# Patient Record
Sex: Female | Born: 1970 | Race: Black or African American | Hispanic: Refuse to answer | Marital: Married | Smoking: Never smoker
Health system: Southern US, Community
[De-identification: ages and names within clinical notes are randomized; demographics above are authoritative.]

## PROBLEM LIST (undated history)

## (undated) DIAGNOSIS — E669 Obesity, unspecified: Secondary | ICD-10-CM

## (undated) DIAGNOSIS — J189 Pneumonia, unspecified organism: Secondary | ICD-10-CM

## (undated) DIAGNOSIS — Z9889 Other specified postprocedural states: Secondary | ICD-10-CM

## (undated) DIAGNOSIS — R519 Headache, unspecified: Secondary | ICD-10-CM

## (undated) DIAGNOSIS — R112 Nausea with vomiting, unspecified: Secondary | ICD-10-CM

## (undated) DIAGNOSIS — O009 Unspecified ectopic pregnancy without intrauterine pregnancy: Secondary | ICD-10-CM

## (undated) DIAGNOSIS — R229 Localized swelling, mass and lump, unspecified: Secondary | ICD-10-CM

## (undated) DIAGNOSIS — D219 Benign neoplasm of connective and other soft tissue, unspecified: Secondary | ICD-10-CM

## (undated) DIAGNOSIS — T8859XA Other complications of anesthesia, initial encounter: Secondary | ICD-10-CM

## (undated) DIAGNOSIS — T4145XA Adverse effect of unspecified anesthetic, initial encounter: Secondary | ICD-10-CM

## (undated) DIAGNOSIS — IMO0002 Reserved for concepts with insufficient information to code with codable children: Secondary | ICD-10-CM

## (undated) DIAGNOSIS — K219 Gastro-esophageal reflux disease without esophagitis: Secondary | ICD-10-CM

## (undated) HISTORY — DX: Unspecified ectopic pregnancy without intrauterine pregnancy: O00.90

## (undated) HISTORY — PX: REDUCTION MAMMAPLASTY: SUR839

## (undated) HISTORY — PX: FOOT SURGERY: SHX648

## (undated) HISTORY — DX: Obesity, unspecified: E66.9

## (undated) HISTORY — PX: CYST REMOVAL HAND: SHX6279

## (undated) HISTORY — PX: OVARIAN CYST REMOVAL: SHX89

## (undated) HISTORY — DX: Headache, unspecified: R51.9

## (undated) HISTORY — DX: Pneumonia, unspecified organism: J18.9

---

## 1999-10-11 ENCOUNTER — Ambulatory Visit (HOSPITAL_COMMUNITY): Admission: RE | Admit: 1999-10-11 | Discharge: 1999-10-11 | Payer: Self-pay | Admitting: Obstetrics and Gynecology

## 1999-10-11 ENCOUNTER — Encounter (INDEPENDENT_AMBULATORY_CARE_PROVIDER_SITE_OTHER): Payer: Self-pay

## 2000-08-11 ENCOUNTER — Ambulatory Visit (HOSPITAL_BASED_OUTPATIENT_CLINIC_OR_DEPARTMENT_OTHER): Admission: RE | Admit: 2000-08-11 | Discharge: 2000-08-12 | Payer: Self-pay | Admitting: Specialist

## 2000-11-04 ENCOUNTER — Other Ambulatory Visit: Admission: RE | Admit: 2000-11-04 | Discharge: 2000-11-04 | Payer: Self-pay | Admitting: Obstetrics and Gynecology

## 2002-02-16 ENCOUNTER — Other Ambulatory Visit: Admission: RE | Admit: 2002-02-16 | Discharge: 2002-02-16 | Payer: Self-pay | Admitting: Obstetrics and Gynecology

## 2003-03-01 ENCOUNTER — Other Ambulatory Visit: Admission: RE | Admit: 2003-03-01 | Discharge: 2003-03-01 | Payer: Self-pay | Admitting: Obstetrics and Gynecology

## 2003-09-05 ENCOUNTER — Encounter: Payer: Self-pay | Admitting: *Deleted

## 2003-09-05 ENCOUNTER — Encounter: Admission: RE | Admit: 2003-09-05 | Discharge: 2003-09-05 | Payer: Self-pay | Admitting: *Deleted

## 2003-09-08 ENCOUNTER — Encounter: Payer: Self-pay | Admitting: *Deleted

## 2003-09-08 ENCOUNTER — Encounter: Admission: RE | Admit: 2003-09-08 | Discharge: 2003-09-08 | Payer: Self-pay | Admitting: *Deleted

## 2004-03-20 ENCOUNTER — Other Ambulatory Visit: Admission: RE | Admit: 2004-03-20 | Discharge: 2004-03-20 | Payer: Self-pay | Admitting: Obstetrics and Gynecology

## 2005-01-14 ENCOUNTER — Encounter: Admission: RE | Admit: 2005-01-14 | Discharge: 2005-01-14 | Payer: Self-pay | Admitting: Family Medicine

## 2005-01-15 ENCOUNTER — Encounter: Admission: RE | Admit: 2005-01-15 | Discharge: 2005-01-15 | Payer: Self-pay | Admitting: Family Medicine

## 2005-01-29 ENCOUNTER — Observation Stay (HOSPITAL_COMMUNITY): Admission: AD | Admit: 2005-01-29 | Discharge: 2005-01-30 | Payer: Self-pay | Admitting: Obstetrics and Gynecology

## 2005-02-01 ENCOUNTER — Observation Stay (HOSPITAL_COMMUNITY): Admission: EM | Admit: 2005-02-01 | Discharge: 2005-02-02 | Payer: Self-pay | Admitting: Obstetrics and Gynecology

## 2005-02-01 ENCOUNTER — Encounter (INDEPENDENT_AMBULATORY_CARE_PROVIDER_SITE_OTHER): Payer: Self-pay | Admitting: *Deleted

## 2005-04-01 ENCOUNTER — Other Ambulatory Visit: Admission: RE | Admit: 2005-04-01 | Discharge: 2005-04-01 | Payer: Self-pay | Admitting: Obstetrics and Gynecology

## 2005-04-30 ENCOUNTER — Ambulatory Visit (HOSPITAL_COMMUNITY): Admission: RE | Admit: 2005-04-30 | Discharge: 2005-04-30 | Payer: Self-pay | Admitting: Obstetrics and Gynecology

## 2006-02-03 ENCOUNTER — Encounter: Admission: RE | Admit: 2006-02-03 | Discharge: 2006-02-03 | Payer: Self-pay | Admitting: Family Medicine

## 2006-04-02 ENCOUNTER — Other Ambulatory Visit: Admission: RE | Admit: 2006-04-02 | Discharge: 2006-04-02 | Payer: Self-pay | Admitting: Obstetrics and Gynecology

## 2007-02-12 ENCOUNTER — Encounter: Admission: RE | Admit: 2007-02-12 | Discharge: 2007-02-12 | Payer: Self-pay | Admitting: Obstetrics and Gynecology

## 2007-04-08 ENCOUNTER — Ambulatory Visit (HOSPITAL_COMMUNITY): Admission: RE | Admit: 2007-04-08 | Discharge: 2007-04-08 | Payer: Self-pay | Admitting: Obstetrics and Gynecology

## 2007-04-08 ENCOUNTER — Encounter (INDEPENDENT_AMBULATORY_CARE_PROVIDER_SITE_OTHER): Payer: Self-pay | Admitting: Specialist

## 2008-01-24 ENCOUNTER — Inpatient Hospital Stay (HOSPITAL_COMMUNITY): Admission: AD | Admit: 2008-01-24 | Discharge: 2008-01-24 | Payer: Self-pay | Admitting: Obstetrics and Gynecology

## 2008-03-24 ENCOUNTER — Inpatient Hospital Stay (HOSPITAL_COMMUNITY): Admission: AD | Admit: 2008-03-24 | Discharge: 2008-03-24 | Payer: Self-pay | Admitting: Obstetrics and Gynecology

## 2008-05-07 ENCOUNTER — Inpatient Hospital Stay (HOSPITAL_COMMUNITY): Admission: AD | Admit: 2008-05-07 | Discharge: 2008-05-12 | Payer: Self-pay | Admitting: Obstetrics and Gynecology

## 2008-05-11 ENCOUNTER — Encounter (INDEPENDENT_AMBULATORY_CARE_PROVIDER_SITE_OTHER): Payer: Self-pay | Admitting: Obstetrics and Gynecology

## 2009-04-10 IMAGING — US US OB LIMITED
1 series · 14 of 22 positions shown · non-contrast
Comparison: none

OBSTETRICAL ULTRASOUND:
 This ultrasound exam was performed in the [HOSPITAL] Ultrasound Department.  The OB US report was generated in the AS system, and faxed to the ordering physician.  This report is also available in [REDACTED] PACS.

[Series 1: us ob limited · 0.30mm/px · 14 of 22 slices shown]
[im 1/22]
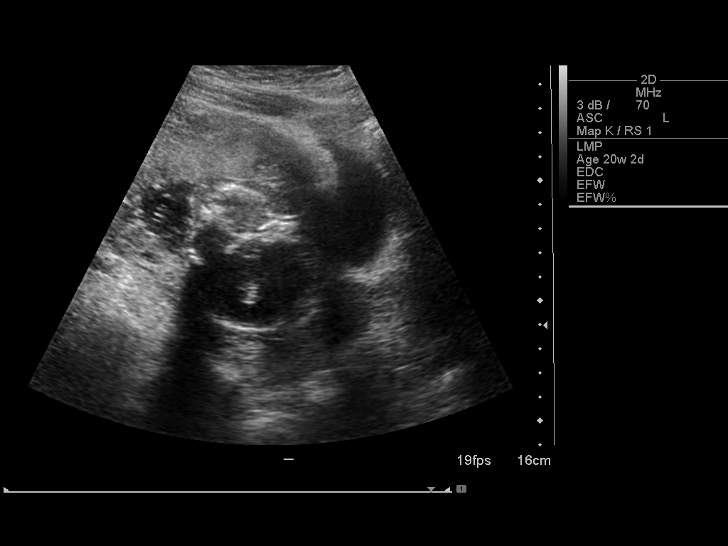
[im 3/22]
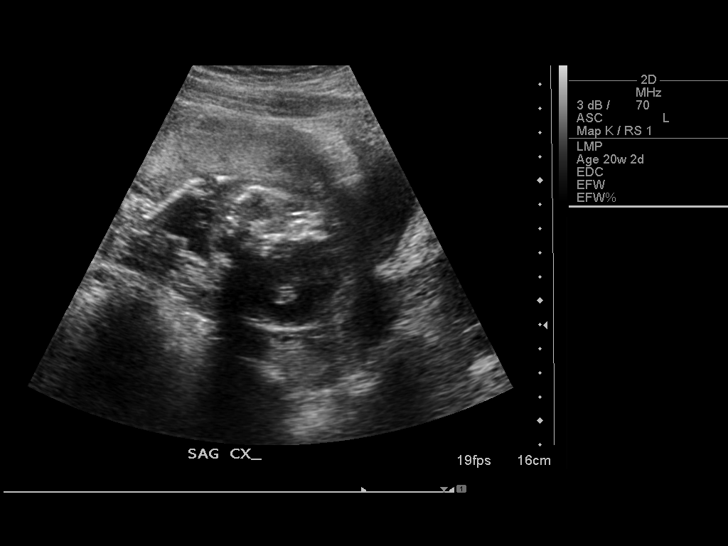
[im 4/22]
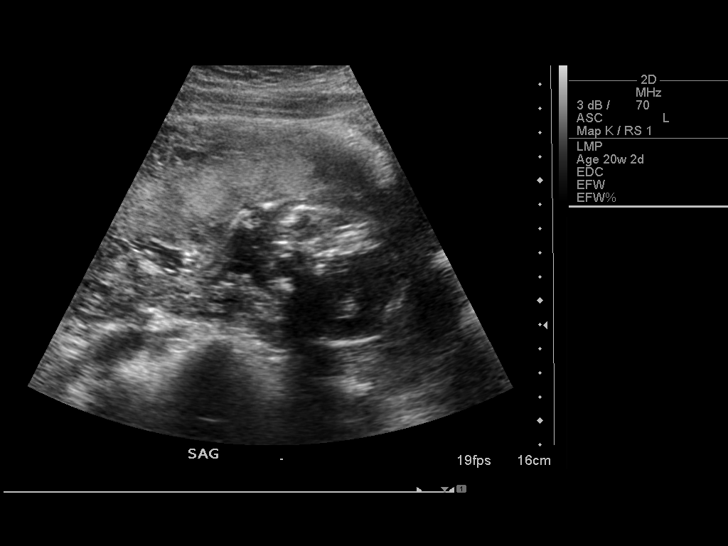
[im 6/22]
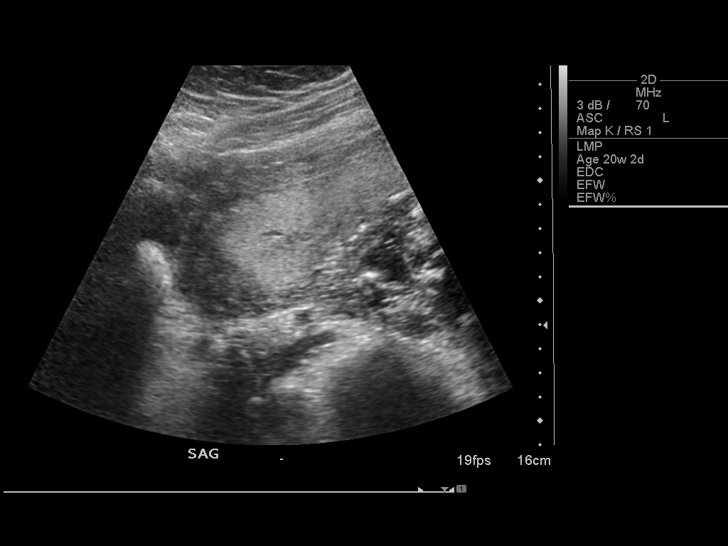
[im 8/22]
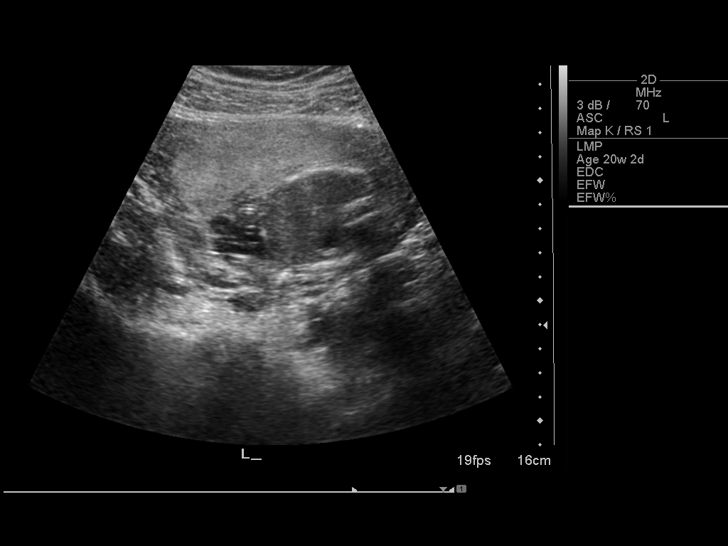
[im 9/22]
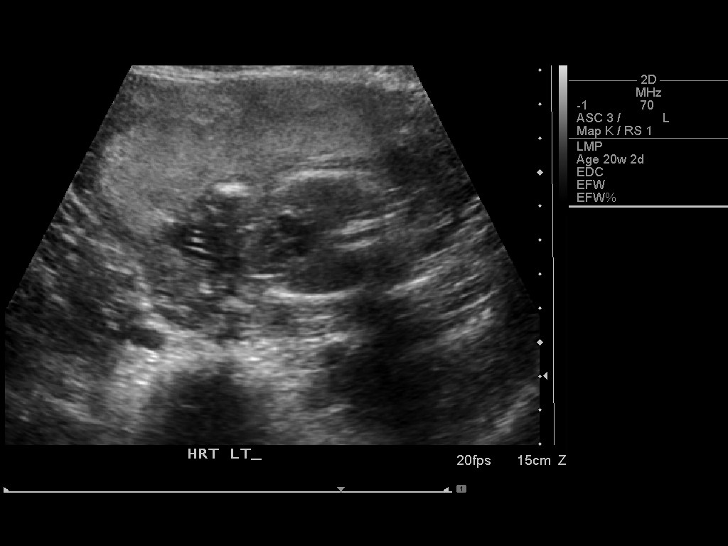
[im 11/22]
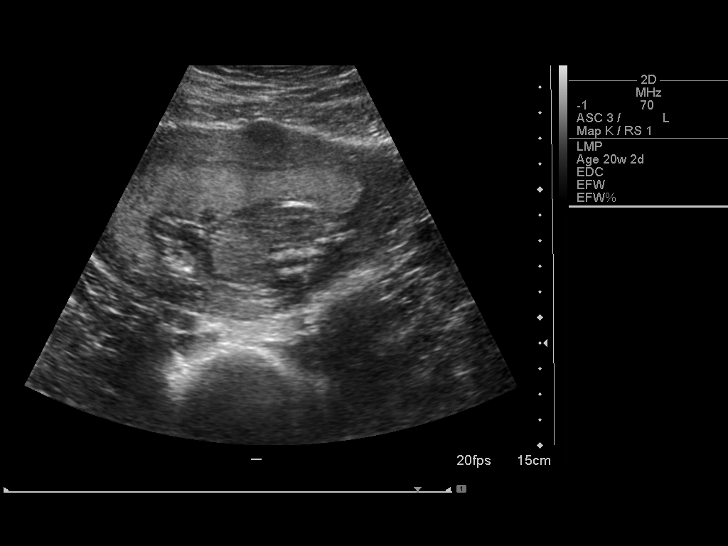
[im 12/22]
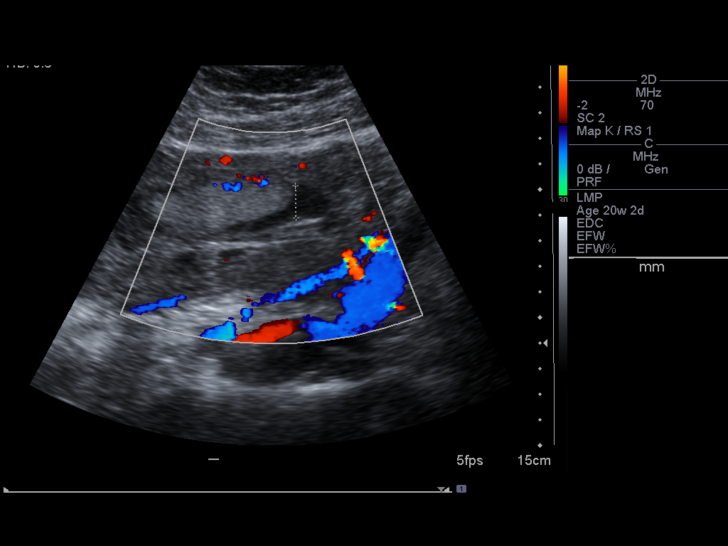
[im 14/22]
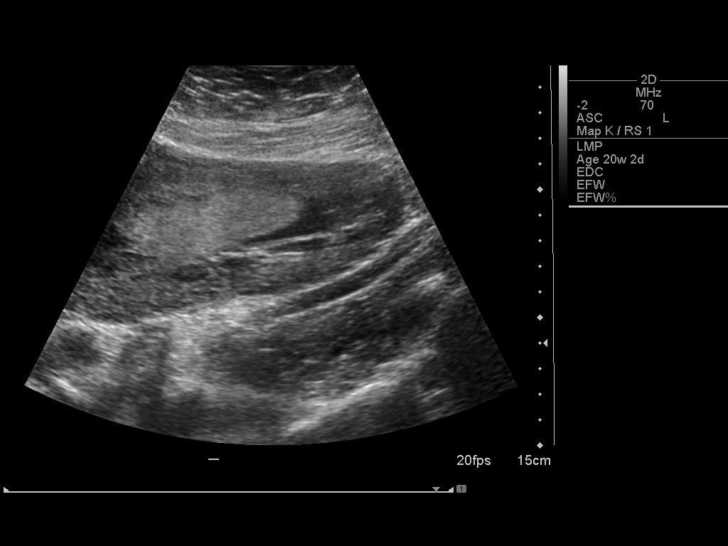
[im 15/22]
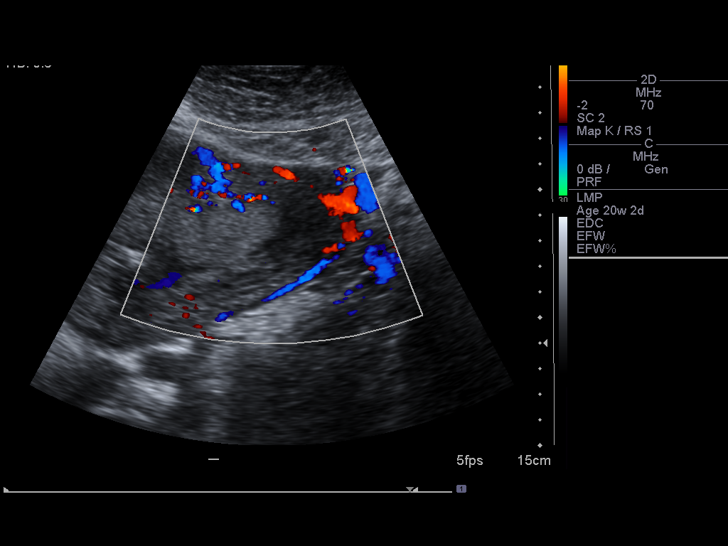
[im 17/22]
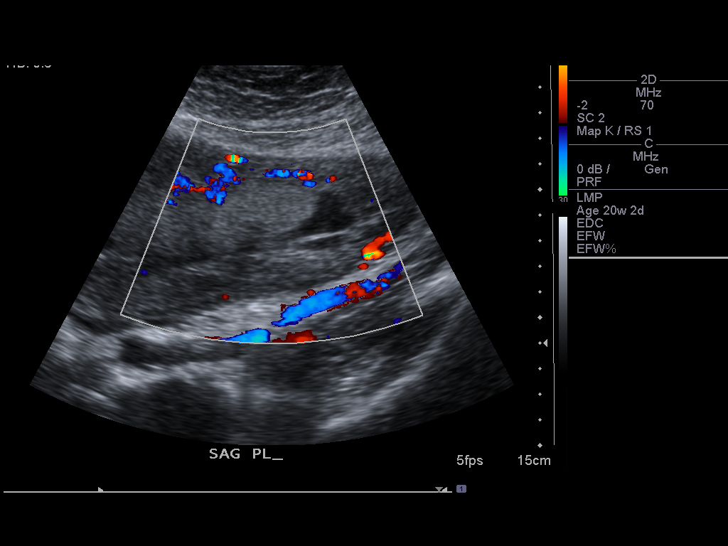
[im 19/22]
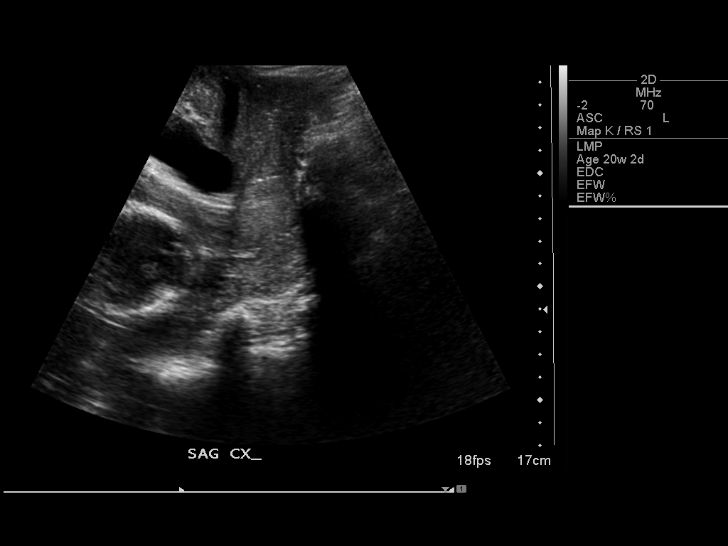
[im 20/22]
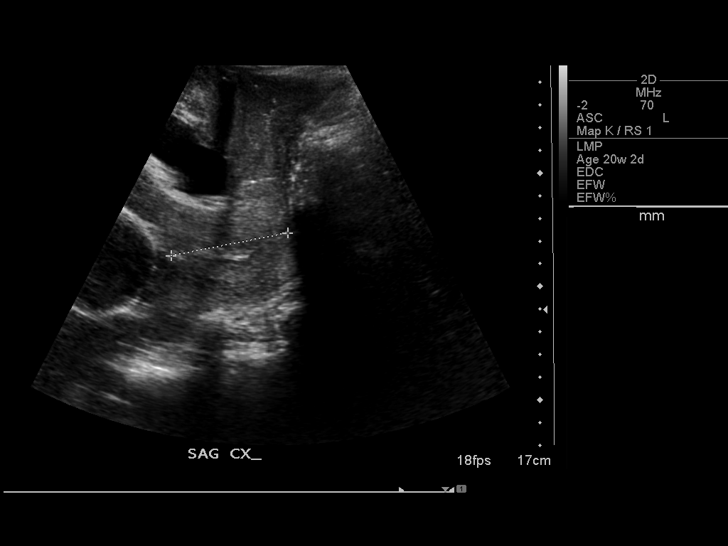
[im 22/22]
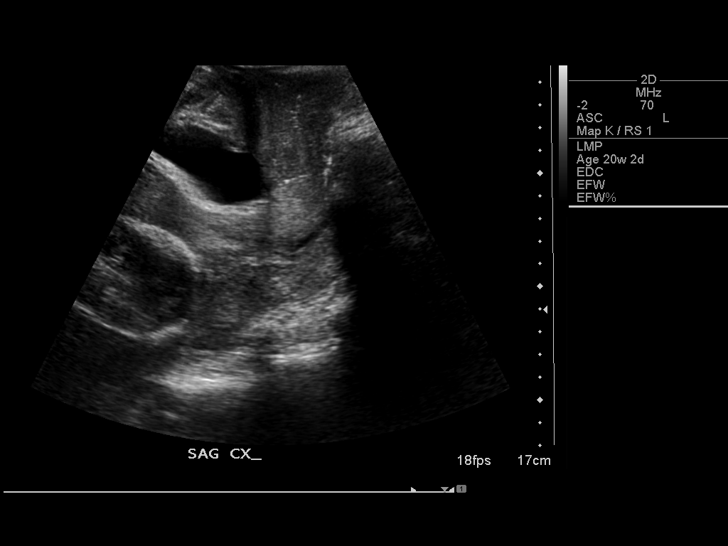

[14 of 22 positions shown; findings below may reference images not displayed]

IMPRESSION: See AS Obstetric US report.

## 2009-12-06 ENCOUNTER — Emergency Department (HOSPITAL_COMMUNITY): Admission: EM | Admit: 2009-12-06 | Discharge: 2009-12-06 | Payer: Self-pay | Admitting: Family Medicine

## 2010-01-16 ENCOUNTER — Emergency Department (HOSPITAL_COMMUNITY): Admission: EM | Admit: 2010-01-16 | Discharge: 2010-01-16 | Payer: Self-pay | Admitting: Emergency Medicine

## 2010-02-08 ENCOUNTER — Encounter: Admission: RE | Admit: 2010-02-08 | Discharge: 2010-03-07 | Payer: Self-pay | Admitting: Orthopedic Surgery

## 2010-05-29 ENCOUNTER — Ambulatory Visit (HOSPITAL_COMMUNITY): Admission: RE | Admit: 2010-05-29 | Discharge: 2010-05-29 | Payer: Self-pay | Admitting: Obstetrics & Gynecology

## 2011-03-04 LAB — URINE CULTURE
Colony Count: NO GROWTH
Culture: NO GROWTH
Special Requests: POSITIVE

## 2011-03-04 LAB — PREGNANCY, URINE: Preg Test, Ur: NEGATIVE

## 2011-03-04 LAB — URINALYSIS, ROUTINE W REFLEX MICROSCOPIC
Ketones, ur: NEGATIVE mg/dL
Leukocytes, UA: NEGATIVE
Nitrite: NEGATIVE
Protein, ur: NEGATIVE mg/dL
Urobilinogen, UA: 0.2 mg/dL (ref 0.0–1.0)

## 2011-03-04 LAB — CBC
MCHC: 34.6 g/dL (ref 30.0–36.0)
RDW: 13.1 % (ref 11.5–15.5)

## 2011-03-08 ENCOUNTER — Other Ambulatory Visit: Payer: Self-pay | Admitting: Obstetrics & Gynecology

## 2011-03-11 ENCOUNTER — Other Ambulatory Visit: Payer: Self-pay | Admitting: Obstetrics & Gynecology

## 2011-03-11 DIAGNOSIS — Z1231 Encounter for screening mammogram for malignant neoplasm of breast: Secondary | ICD-10-CM

## 2011-03-14 ENCOUNTER — Ambulatory Visit
Admission: RE | Admit: 2011-03-14 | Discharge: 2011-03-14 | Disposition: A | Discharge status: Home / Self Care | Payer: Private Health Insurance - Indemnity | Source: Ambulatory Visit | Attending: Obstetrics & Gynecology | Admitting: Obstetrics & Gynecology

## 2011-03-14 DIAGNOSIS — Z1231 Encounter for screening mammogram for malignant neoplasm of breast: Secondary | ICD-10-CM

## 2011-03-18 LAB — URINE CULTURE

## 2011-03-18 LAB — POCT URINALYSIS DIP (DEVICE)
Glucose, UA: NEGATIVE mg/dL
Specific Gravity, Urine: 1.025 (ref 1.005–1.030)

## 2011-04-30 NOTE — Discharge Summary (Signed)
NAMEJAMALA, KOHEN                ACCOUNT NO.:  [id number]   MEDICAL RECORD NO.:  [id number]          PATIENT TYPE:  INP   LOCATION:  9303                          [facility]ACILITY:  [facility]   PHYSICIAN:  Hal Morales, M.D.DATE OF BIRTH:  1971/06/26   DATE OF ADMISSION:  05/07/2008  DATE OF DISCHARGE:  05/12/2008                               DISCHARGE SUMMARY   REFERRING PHYSICIAN:  Hal Morales, M.D.   ADMISSION DIAGNOSES:  1. Intrauterine pregnancy at 20 and 2/7 weeks'.  2. Preterm premature rupture of membranes.   DISCHARGE DIAGNOSES:  1. Status post spontaneous vaginal delivery of a nonviable female at      5 and 6/7 weeks' by the name of Caleb at 9:55 a.m. on 05-29-23     2009, weighing 11 ounces 12 inches in length.  Apgars of 1 at one      minute and 1 at five minutes and subsequent confirmed death at      12:10 p.m. on 05/28/2008.  2. Prolonged rupture of membranes.   PROCEDURES:  Not applicable.   HOSPITAL COURSE:  Ms. Dittrich is a 40 year old gravida 2, para 0-0-1-0 who  presented on her date of admission with complaints of spontaneous  rupture of membranes which she reported with clear fluid earlier in that  day.  She had been having some intermittent cramping before coming in to  the hospital and none on arrival.  She had also reported passing 2 small  clots after a gushof fluid but had no further bleeding.  She denied any  other pain or complaints at that time.  She did report feeling fetus  move.  Pregnancy had been followed by [facility] at [facility].  Her history  was remarkable for:  1. History of an ectopic pregnancy.  2. Advanced maternal age.  3. History of uterine fibroids.  4. First trimester spotting.  5. Resolved placenta previa .   She was seen by Elsie Ra, certified nurse midwife at time of admission  and spontaneous rupture of membranes was confirmed.  Cervical exam was 1-  2 cm 50% minus 2 vertex.  At time of admission, vital signs were stable.  Physical exam was otherwise within normal limits.  GBS and GC and  chlamydia cultures were obtained.  Fetal heart tones were obtained with  Doppler.  She had a ultrasound and hydramnios was noted.  Her cervix was  5.24 cm translabially.  Dr. Pennie Rushing then assessed patient and discussed  plan, offered:  1. Expectant management.  2. Induction of labor.  Risk and benefits of both were reviewed.  Patient and husband did opt at  the time of admission for expectant management.  Plan was made to repeat  ultrasound after 72 hours of IV fluid was explained to patient that most  common outcome was spontaneous labor within 72 hours of rupture.  Following day after admission afternoon of May 24, patient was doing  okay, was appropriately worried but hopeful.  Friends and family  remained at bedside and supportive.  Her vital signs were stable.  She  was afebrile.  Physical exam remained within normal limits.  Fetal heart  rate 135-140.  Abdomen remained soft and nontender.  She did complain of  some intermittent uterine cramping.  She continued to leak clear fluid.  Plan was made to continue current expectant management and to check a  CBC on May 09, 2008.  She was receiving Unasyn 3 g IV every 6 hours.  On  evening of May 09, 2008, at 20 and 4/7 weeks', patient was complaining  of some sporadic cramping but positive fetal moment.  Vital signs  remained stable and she was afebrile.  Abdomen remained soft and  nontender.  Fetal heart rate was 140s-150s per Doppler.  Continued to  have a small, small amount of pinkish fluid on her pad.   LABORATORIES:  Hemoglobin was 13.6, white count was 12.9, platelets were  239.  GC and chlamydia cultures were negative and GBS culture was  negative.  Urine culture showed approximately 80,000 colonies, gram-  negative rods.   Plan was made to continue with expectant management and to continue to  observe for signs and symptoms of infection or spontaneous labor.   Morning of the 26th of May, she was 20 and 5/7 weeks', did note increase  in uterine contractions approximately every 15 minutes since 2 a.m.,  continued to have positive fetal movement.  Temperature that morning was  99.  Other vital signs were stable.  Blood pressure was 136/72.  Abdomen  with some mild lower tenderness to palpation.  Fetal heart rate 160.  On  my check, she had small amount of yellow, pink drainage and otherwise  physical exam within normal limits.  Close observation continued and  support was offered.  Later in the day on May 26, RN had called to  report increasing contractions to every 9-10 minutes.  Reported pain was  very severe on pain scale, and patient was desiring pain medication.  She was afebrile 98.2.  Her speculum exam, she was approximately 2 cm  visually dilated and patient was given Stadol p.r.n. and continue  expectant management.  Morning of May 11, 2008, at 20 and 6/7 weeks',  Dr. Estanislado Pandy was called into room by patient's nurse at approximately 9:45  for increased bleeding and continued increased pain.  Patient reporting  pain every 2 minutes.  On vaginal exam, fetal head was in vagina.  Temperature was afebrile at 8 a.m.  Patient and husband informed of  imminent delivery and subsequently patient instructed to begin pushing  and delivery as female nonviable infant was at 9:55 a.m. on May 11, 2008.  Infant was alive with heart rate around 60-70 beats per minute at time  of delivery.  Patient and husband and family were given baby and  appropriate bonding and grieving, female given the name of Wilber Oliphant.  Placenta delivered spontaneously and appeared intact at 10:30 a.m. and  was sent to Pathology.  EBO was less than 400.  No other complications.  Support given to patient and family.  Later at 12:10 a.m., death  confirmed of the female infant secondary to prematurity.  Continued  support given to family, offered Chaplin visit at that time.  Patient  declined and  all questions were answered by Dr. Estanislado Pandy.  By postpartum  day #1 on May 28, patient was verbalizing desire for discharge to home.  She reported vaginal bleeding was like a period, concerned may develop a  yeast infection from the antibiotic.  Pain well-controlled with Motrin  and Percocet.  She has been up ad lib.  She is tolerating a regular  diet.  She is voiding without difficulty, is adjusting and coping well  with loss.  At present not planning funeral service.  Undecided on  contraception.  Her vital signs remain stable.  White count today was  10.5, hemoglobin 11.1, platelets stable at 265.  Physical exam was  within normal limits.  Abdomen was soft, nontender.  Fundus was firm 2  fingerbreadths below umbilicus.  Lochia small rubra.  Extremities with  some mild edema but negative Homans.  Patient was deemed to have  received full benefit of her hospital stay and was discharged home May 12, 2008, status post a preterm delivery at 20 and 6/7 weeks'.   DISCHARGE INSTRUCTIONS:  Per [facility] pamphlet, Taking  Care of Yourself.  Patient was given 4 prescriptions:  1. Motrin 600 mg p.o. every 6 hours p.r.n. pain.  2. Percocet 5/325 one to two tabs p.o. every 6 hours p.r.n. moderate      to severe pain.  3. Ambien 10 mg 1 tab p.o. at bedtime p.r.n. insomnia.  4. Diflucan 150 mg one tab p.o. times 1 and then with 1 refill.   Patient encouraged to call office and make appointment to followup in 2-  3 weeks or p.r.n. and also encouraged to call with any questions or  concern or as needed.      Candice Lincroft, [place]      Hal Morales, M.D.  Electronically Signed    CHS/MEDQ  D:  05/12/2008  T:  05/12/2008  Job:  [id number]

## 2011-04-30 NOTE — H&P (Signed)
Stefanie Baldwin, Stefanie Baldwin                ACCOUNT NO.:  [id number]   MEDICAL RECORD NO.:  [id number]          PATIENT TYPE:  INP   LOCATION:  9153                          [facility]  [facility]   PHYSICIAN:  Hal Morales, M.D.DATE OF BIRTH:  Jan 18, 1971   DATE OF ADMISSION:  05/07/2008  DATE OF DISCHARGE:                              HISTORY & PHYSICAL   HISTORY OF PRESENT ILLNESS:  This is a 40 year old gravida 2, para 0-0-1-  0 who presents at 20-2/7 weeks' gestation with complaints of possible  spontaneous rupture of membranes.  The patient had a gush of possible  clear fluids earlier today and she reports that she is continued to  remain damp since that time.  The patient denies any cramping at the  present time; however, she does report cramping, which occurred earlier  in the week.  The patient also reports that she did pass two small clots  after the gush of fluid that she had noted as well, but she has not  noted any further vaginal bleeding.  The patient denies any headache,  vision changes, or right upper quadrant pain.  The patient reports that  she is feeling fetus move.  The patient's pregnancy followed by the MD  Service, [facility].  The patient's pregnancy is remarkable  for:  1. History of ectopic pregnancy.  2. Advanced maternal age.  3. History of uterine fibroids.  4. First-trimester spotting.  5. Placenta previa, resolved.   PRENATAL LABS:  The patient's blood type O positive and antibody screen  negative.  Initial hemoglobin 14.4, hematocrit 42.0, and platelets  255,000.  Sickle cell trait is negative.  RPR nonreactive, rubella titer  immune, hepatitis B surface antigen negative, and HIV nonreactive.  Pap  smear within normal limits in September 2008.  Gonorrhea and Chlamydia  cultures negative.  Cystic fibrosis declined. Genetic screening  declined.   HISTORY OF PRESENT PREGNANCY:  The patient entered care at an 8 weeks'  gestation.  The patient elected  not to proceed with any genetic  screening for advanced maternal age.  The patient with bleeding earlier  in the pregnancy; however, ultrasound done at her new OB visit, revealed  placenta previa, which had resolved.  The patient presented at 14 weeks  with complaints of mid abdominal pain for 24 hours.  Gonorrhea and  Chlamydia cultures obtained at that time were negative.  At that time,  mid uterine tenderness was noted.  The patient was seen in MAU for CBC  and a CMET and was discharged.  The patient underwent ultrasound for  anatomy at 19 weeks' gestation.   OB HISTORY:  1. Pregnancy #1 in 2007, ectopic, laparoscopic evaluation.  2. Pregnancy #2 is current.   GYN HISTORY:  The patient reports menarche at age 29 with regular  monthly menses.  The patient's GYN history significant for fibroids and  endometriosis.  The patient did conceive on Triphasil with this current  pregnancy.  The patient denies any history of abnormal Paps for STDs.   PAST MEDICAL HISTORY:  Negative.   PAST SURGICAL HISTORY:  Foot surgery 2 years ago in 2007; breast  reduction in 2001; ectopic in 2007; and ovarian cyst surgeries, date  unknown.   CURRENT MEDICATIONS:  Prenatal vitamins.   ALLERGIES:  No known drug allergies.   FAMILY HISTORY:  Mother with hypertension, diabetes, and  hyperthyroidism.  Maternal aunt, hypertension and diabetes.  Father,  diabetes.   GENETIC HISTORY:  Significant for advanced maternal age only.   SOCIAL HISTORY:  The patient is married to the father of the baby,  Kimberlynn Lumbra who is involved and supportive.  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  Father of the  baby is a Production designer, theatre/television/film at [employer].  The patient and her husband reports  Jehovah's Witness Affiliation.  The patient denies any use of alcohol,  tobacco, or street drugs.   OBJECTIVE:  VITAL SIGNS:  The patient is afebrile.  Temp 97.9, blood  pressure 131/74, and remainder of vitals  is stable.  HEENT:  Within normal limits.  CHEST:  Clear.  HEART:  Regular rate and rhythm.  ABDOMEN:  Abdomen is soft, nontender with fundal height at approximately  20 cm consistent with gestational age.  Fetal heart rate is present per  Doppler.  PELVIC:  Small amount of clear to yellow discharge noted in the vaginal  vault.  Cervix with a small amount of yellow discharge noted.  No  pulling is seen.  Positive amnio swab, positive firm.  Sterile vaginal  exam found cervix to be 1 cm dilated, 50% effaced, -2 station, vertex  presentation, and gush of yellow to clear fluid noted at that completion  of sterile vaginal exam.  Ultrasound done reveals anhydramnios.  Fetal  heart tones present.  Fetal growth was not assessed.  EXTREMITIES:  Negative Homans bilaterally.  Negative edema bilaterally.   ASSESSMENT:  1. Preterm premature rupture of membranes.  2. Advanced maternal age.   PLAN:  Consult obtained with Dr. Pennie Rushing.  The patient to be admitted to  birthing suites or antepartum based on the patient's management desires  and Dr. Pennie Rushing to see the patient further orders per MD and the patient  to be followed by MD.      Rhona Leavens, CNM      Hal Morales, M.D.  Electronically Signed    NOS/MEDQ  D:  05/07/2008  T:  05/08/2008  Job:  [id number]

## 2011-05-03 NOTE — Op Note (Signed)
NAMELEGNA, Stefanie Baldwin                ACCOUNT NO.:  [id number]   MEDICAL RECORD NO.:  [id number]          PATIENT TYPE:  AMB   LOCATION:  SDC                           [facility]   PHYSICIAN:  Hal Morales, M.D.DATE OF BIRTH:  05-Feb-1971   DATE OF PROCEDURE:  04/08/2007  DATE OF DISCHARGE:                               OPERATIVE REPORT   PREOPERATIVE DIAGNOSES:  Pelvic pain, left ovarian cyst.   POSTOPERATIVE DIAGNOSES:  Pelvic pain, pelvic adhesions, left ovarian  cyst, possible endometriosis, uterine fibroids.   PROCEDURE:  Laparoscopic left ovarian cystectomy, lysis of adhesions,  peritoneal biopsies.   SURGEON:  Hal Morales, M.D.   FIRST ASSISTANT:  Crist Fat. Rivard, M.D.   ANESTHESIA:  General orotracheal.   ESTIMATED BLOOD LOSS:  Less than 50 mL.   COMPLICATIONS:  None.   FINDINGS:  The left ovary contained a 4 cm left ovarian simple cyst.  The left tube was adherent to the posterior uterus by filmy adhesions.  There were, likewise, adhesions between the tube and the left ovary.  The right tube and ovary appeared normal.  There was a 4 cm fundal  fibroid on the uterus and two smaller fibroids on the posterior uterus,  each approximately 1 cm.  These were subserosal to intramural.  In the  posterior cul-de-sac at the center of the junction of the uterosacral  ligaments, there was a peritoneal window suggestive of endometriosis.   DESCRIPTION OF PROCEDURE:  The patient is taken to the operating room  after appropriate identification and placed on the operating table.  After the attainment of adequate general anesthesia, she was placed in  the modified lithotomy position.  The abdomen, perineum and vagina were  prepped with multiple layers of Betadine and a Foley catheter inserted  into the bladder then connected to straight drainage.  The patient's  Nuvaring contraceptive was noted to have been removed during vaginal  prepping. An Acorn cannula was placed  in the cervix and a single tooth  tenaculum placed on the anterior cervix.  The abdomen was draped as a  sterile field.  Subumbilical and suprapubic injections of 0.25% Marcaine  for total of 10 mL were undertaken.  The subumbilical incision was then  made and the Veress cannula placed through that incision into the  perineal cavity.  A pneumoperitoneum was created with 3 liters of CO2.  The Veress cannula was removed and the laparoscopic trocar placed  through that incision into the peritoneal cavity.  Laparoscopic probe  trocars were placed through incisions in the right and left suprapubic  regions under direct visualization.  The above noted findings were made.   Laparoscopic scissors were used to lyse adhesions between the fimbriated  end of the left tube and the posterior uterus.  Lysis of adhesions  around the ovary was, likewise, carried out.  The ovarian cortex was  incised and that incision taken half the circumference of the ovary on  the antimesenteric side.  The cortex was peeled off the underlying  ovarian cyst with a combination of blunt and sharp dissection and the  cyst wall removed from the overlying ovarian cortex.  The cyst wall was  removed through the subumbilical laparoscopic port.  A fourth port was  placed in the right abdomen approximately 8 cm from the midline and  about halfway between the symphysis pubis and the umbilicus.  A 5 mm  trocar was placed through that incision into the perineal cavity under  direct visualization.  A combination of bipolar cautery and irrigation  was used to achieve adequate hemostasis in the left ovarian cortex  status post removal of the ovarian cyst.   Once hemostasis was noted to be adequate, peritoneal biopsies were  obtained from the posterior cul-de-sac at the site of the peritoneal  windows and removed from the operative field.  Hemostasis at that time  was noted to be adequate and approximately 60 mL of warm lactated   Ringer's was left in the peritoneal cavity.  All instruments were then  removed from the peritoneal cavity under direct visualization and the  CO2 allowed to escape.  The subumbilical incision was closed with a  fascial suture of 0 Vicryl then subcuticular sutures of 3-0 Vicryl.  The  remainder of the three 5 mm incisions were closed with Steri-Strips.  All instruments were removed from the vagina and the Foley catheter  removed. A new Nuvaring was placed in the vagina. The patient was taken  from the operating room to the recovery room in satisfactory condition  having tolerated the procedure well with sponge and instrument counts  correct.   SPECIMENS TO PATHOLOGY:  Left ovarian cyst wall and peritoneal biopsies.   DISCHARGE MEDICATIONS:  Ibuprofen 600 mg p.o. q.6h. for 3 days then  p.r.n. pain, Vicodin 1-2 p.o. q.4h. p.r.n. pain, and Zofran OTD 8 mg  p.o. q.8h. p.r.n. nausea.      Hal Morales, M.D.  Electronically Signed     VPH/MEDQ  D:  04/08/2007  T:  04/08/2007  Job:  [id number]

## 2011-05-07 NOTE — Discharge Summary (Signed)
NAMELOYE, REININGER                ACCOUNT NO.:  [id number]   MEDICAL RECORD NO.:  [id number]          PATIENT TYPE:  OBV   LOCATION:  9320                          [facility]ILITY[facility]   PHYSICIAN:  Hal Morales, M.D.DATE OF BIRTH:  25-Apr-1971   DATE OF ADMISSION:  02/01/2005  DATE OF DISCHARGE:  02/02/2005                                 DISCHARGE SUMMARY   DISCHARGE DIAGNOSES:  1.  Left tubal ectopic pregnancy.  2.  Uterine fibroids.   OPERATION:  On the date of admission, the patient underwent an operative  laparoscopy with left tubal salpingostomy for removal of ectopic pregnancy,  tolerating procedure well. The patient was found to have a uterus which was  enlarged with a 4 cm left fundal fibroid. The right tube and ovary appeared  normal. The left ovary appeared normal. The left tube was enlarged near the  isthmus portion to approximately 2 cm with an apparent ectopic pregnancy.  There were approximately 250 mL of blood in the pelvis including clots.  There appeared to be some elements of products of conception within the  blood and clots. Upon salpingostomy there was a 1 cm bloody products-of-  conception-appearing mass that was removed.   HISTORY OF PRESENT ILLNESS:  [name] is a 40 year old African-American  female gravida 1 para 0 who presents for surgical management of a left  ectopic pregnancy. Please see the patient's dictated History and Physical  Examination for details.   ADMISSION PHYSICAL EXAMINATION:  GENERAL:  The patient is well-developed  African-American female with mild abdominal distress.  VITAL SIGNS:  Blood pressure 130/70, weight is 206 pounds. General exam was  within normal limits.  PELVIC:  Exam was performed on January 28, 2005 and showed normal external  genitalia along with normal BUS. The vagina was normal. Cervix and no  cervical motion tenderness.  Uterus was upper limits of normal size, normal  shape and consistency, anterior, mobile, and  nontender. Adnexa revealed no  palpable masses.   HOSPITAL COURSE:  On the day of admission the patient underwent  aforementioned procedure, tolerating it well. However, immediately  postoperatively the patient experienced severe nausea and vomiting which  caused her to be admitted for 23-hour observation. With antiemetics, the  patient's symptoms subsided and by postoperative day #1 she was deemed ready  for discharge home.   DISCHARGE MEDICATIONS:  1.  Vicodin one to two tablets q.4h. as needed for pain.  2.  Tylenol two tablets q.4h. as needed for mild pain.  3.  Triphasil one tablet daily starting on February 03, 2005.   FOLLOW-UP:  The patient is scheduled for 2 weeks follow-up with Dr. Pennie Rushing  at [facility].   DISCHARGE INSTRUCTIONS:  The patient was given a copy of [facility] home care instructions for laparoscopy.   ACTIVITY:  As per the laparoscopy home care instruction sheet.   DIET:  As per the laparoscopy home care instruction sheet.   FINAL PATHOLOGY:  Chorionic villi consistent with the clinical history of  ectopic pregnancy.      EJP/MEDQ  D:  02/20/2005  T:  02/20/2005  Job:  [id number]

## 2011-05-07 NOTE — H&P (Signed)
NAME:  Stefanie Baldwin, Stefanie Baldwin                ACCOUNT NO.:  [id number]   MEDICAL RECORD NO.:  [id number]          PATIENT TYPE:  AMB   LOCATION:  SDC                           FACILITY:  [facility]   PHYSICIAN:  Hal Morales, M.D.DATE OF BIRTH:  1971/07/17   DATE OF ADMISSION:  02/01/2005  DATE OF DISCHARGE:                                HISTORY & PHYSICAL   HISTORY OF PRESENT ILLNESS:  The patient is a 40 year old black married  female gravida 1, para 0 who presents for surgical management of probable  left ectopic pregnancy. The patient had her last normal menstrual period  December 27, 2004. She had discontinued contraception in November 2005 and  started prenatal vitamins December of 2005. She was seen on January 24, 2005, complaining of pelvic and abdominal pain and underwent a quantitative  HCG which was 379. She had a follow-up HCG on January 26, 2005, of 882,  showing an appropriate increase. On January 28, 2005, the patient presented  to [facility] complaining of cramping and spotting. At that  time she underwent a repeat HCG which showed an appropriate rise of 1780.8,  however ultrasonography performed the following day showed a 3 x 5 cm  heterogeneous mass in the left adnexa adjacent to the left ovary and a small  amount of free fluid with findings highly suspicious for an ectopic  pregnancy. There was no evidence of any intrauterine gestational sac. There  were several fibroids in the anterior uterine wall noted. The largest  measuring 3 cm. At that time a discussion was held with the patient  concerning the size of the ectopic and possibility that surgical management  was required because of that size. At that time the patient felt she did not  want to undergo a surgical procedure because she was afraid of losing her  tube. 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 Because her pain at that time was noted to be severe she was  admitted overnight for observation from February 14 to January 30, 2005.  She was given the option of utilizing methotrexate because of her refusal of  surgery and after discussion of the risks and benefits of methotrexate,  decided to proceed with that medication. She required only two doses of pain  medication during her hospitalization and felt that her pain was manageable  at a level of 6 to 7 out of 10. She was discharged home on January 30, 2005, however called on January 31, 2005, stating that she felt uncertain  as to whether she wanted to pursue surgery. Several long discussions were  held with the patient concerning the pros and cons of surgical management of  her ectopic versus continued management with methotrexate. She continued to  be concerned about the preservation of her fallopian tube; however, that  concern seemed to be overidden by a concern that she could experience  rupture of her ectopic while awaiting complete treatment with methotrexate  and precipitate an emergency  that would make transfusion a reasonable  recommendation. She continues to refuse to consider transfusion and  therefore wants to minimize the chance that she will be in a situation where  it would be recommended as a life-saving modality. She thus proceeded to  make plans for surgical management of her tubal pregnancy. She declined  surgery on January 31, 2005 because she had already had significant oral  intake and stated she wanted to plan to have it done on February 01, 2005.   PAST MEDICAL HISTORY:  Positive for interstitial cystitis which has been  managed by Bertram Millard. Dahlstedt, M.D. and Lindaann Slough, M.D.   GYN HISTORY:  Significant for pelvic pain which has been thought to be due  to her interstitial cystitis but also a history of uterine fibroids noted  first in 2000. She had used  oral contraceptive pills for  contraception  until November of 2005.   CURRENT MEDICATIONS:  1.  Prenatal vitamins.  2.  Vicodin one to two p.o. q.4h p.r.n. pain.   ALLERGIES:  No known drug sensitivities.   FAMILY HISTORY:  The patient has a history of thyroid disease in her mother  and maternal aunt. Chronic hypertension in her mother, diabetes in her  maternal grandmother and in her father.   SURGICAL HISTORY:  The patient had a left wrist cyst removed at age 60 under  general anesthesia with no problems. She underwent a laparoscopy and right  ovarian cystectomy in 2000.   HABITS:  The patient eats a regular diet, does not smoke cigarettes, drinks  alcohol infrequently and none since learning of her pregnancy and uses no  recreational drugs.   SOCIAL HISTORY:  The patient is married and is of the [employer] Witness  faith with a specific desired not to receive any blood products. She  maintains a medical directive that states this very clearly as well as a  health care power of attorney that likewise states that no blood products  are to be administered.   REVIEW OF SYSTEMS:  Significant for pelvic pain, particularly in the left  lower quadrant. No significant nausea or vomiting. There has been some  vaginal spotting over the last week with a lest menstrual period having been  normal in January 2006.   PHYSICAL EXAMINATION:  GENERAL:  The patient is a well-developed black  female and mild abdominal distress.  VITAL SIGNS:  Blood pressure is 130/70, the weight is 206 pounds.  LUNGS: Clear.  HEART: Regular rate and rhythm.  ABDOMEN:  Soft without masses or organomegaly. There is no suprapubic  tenderness.  PELVIC:  Performed up on January 28, 2005 showed normal external genitalia,  Bartholin's, urethra, and Skene's.  The vagina was normal. The cervix had no  cervical motion tenderness. The uterus was  upper limit of normal size, normal shape, consistency, anterior, mobile and nontender. Adnexae no   palpable masses.   LABORATORY STUDIES:  As noted above. Hemoglobin on January 28, 2005 was  14.2.   IMPRESSION:  1.  Probable tubal ectopic pregnancy, though ovarian pregnancy ,even though      it is quite rare, cannot be completely ruled out. Tubal abortion can      also not be completely ruled out.  2.  Patient who initially desired methotrexate therapy now desires surgical      therapy.  3.  [employer] Witness faith precluding in the patient's mind the use of any      blood products of even in  life-threatening situation.   DISPOSITION:  Several discussions have been held with the patient concerning  options for management. She has decided she wants to proceed with  laparoscopy with laparoscopic removal of ectopic pregnancy. She would like  to have her tube preserved if at all possible, but understands that a large  tubal ectopic pregnancy may preclude preservation of her tube. She also  understands that in the rare occurrence that an ovarian pregnancy is  present, that occasionally the ovary needs to be removed. She likewise  understands that laparotomy may be required for removal of the tubal  pregnancy. She originally asked about having her fibroids removed  simultaneously, but now understands after our discussion that this should  not be pursued as part of surgery for an ectopic which is being done as a  life-saving procedure and a myomectomy would be an elective procedure which  might indeed entail significant blood  loss. She thus consents to laparoscopy with laparoscopic removal of ectopic  pregnancy, possible laparotomy, possible salpingectomy, possible  oophorectomy. This will be performed at [facility] on February 01, 2005. The patient's blood type is O+.      VPH/MEDQ  D:  01/31/2005  T:  01/31/2005  Job:  [id number]

## 2011-05-07 NOTE — Op Note (Signed)
NAMEBETTYMAE, Stefanie Baldwin                ACCOUNT NO.:  [id number]   MEDICAL RECORD NO.:  [id number]          PATIENT TYPE:  AMB   LOCATION:  SDC                           [facility]   PHYSICIAN:  Hal Morales, M.D.DATE OF BIRTH:  1971/04/02   DATE OF PROCEDURE:  02/01/2005  DATE OF DISCHARGE:                                 OPERATIVE REPORT   PREOPERATIVE DIAGNOSES:  1.  Left tubal ectopic pregnancy.  2.  Uterine fibroids.   POSTOPERATIVE DIAGNOSES:  1.  Left tubal ectopic pregnancy.  2.  Uterine fibroids.   OPERATION:  Operative laparoscopy with left tubal salpingostomy for removal  of ectopic pregnancy.   SURGEON:  Hal Morales, M.D.   ANESTHESIA:  General orotracheal.   ESTIMATED BLOOD LOSS:  Approximately 250 mL in the pelvis and less than 50  mL at the time of surgery.   SPECIMENS TO PATHOLOGY:  Portions of ectopic pregnancy.   FINDINGS:  The uterus was enlarged with a 4 cm left fundal fibroid.  The  right tube and ovary appeared normal.  The left ovary appeared normal.  The  left tube was enlarged near the isthmic portion to approximately 2 cm with  an apparent ectopic pregnancy.  There was approximately 250 mL of blood in  the pelvis including clots.  There appeared to be some elements of products  of conception within that blood and clots.  Upon salpingostomy, there was a  1 cm bloody products of conception-appearing mass that was removed.   PROCEDURE:  The patient was taken to the operating room after appropriate  identification and placed on the operating table.  After the attainment of  adequate general anesthesia, she was placed in the modified lithotomy  position.  The abdomen, perineum and vagina were prepped with multiple  layers of Betadine and a Foley catheter inserted into the bladder and  connected to straight drainage.  A single-tooth tenaculum was placed on the  anterior cervix.  The abdomen was draped as a sterile field.  Subumbilical  and  suprapubic injections of 0.25% Marcaine was undertaken for total of 10  mL.  A subumbilical incision was made and a Veress cannula placed through  that incision into the peritoneal cavity.  A pneumoperitoneum was created  with 3 L of CO2.  The Veress cannula was removed an 11 mm laparoscopic  trocar placed through that incision into the peritoneal cavity.  The lateral  scope was placed through the trocar sleeve.  Suprapubic incisions were made  to the right and left of midline and in the left suprapubic incision, a 5 mm  trocar was placed into the peritoneal cavity under direct visualization.  In  the right incision, an a 11 mm trocar was placed into the peritoneal cavity  under direct visualization.  The above-noted findings were made and  documented.  The tissue that was in the posterior cul-de-sac was then  removed through the right trocar port and the left tube was then injected  with a dilute solution of Pitressin (10 units in 50 mL of saline) and  incised.  Once the lumen of the tube had been incised, the ectopic pregnancy  extruded and was lifted out through the right suprapubic port.  Copious  irrigation of the lumen of the tube was carried out with extrusion of some  products of conception out the end of the tube.  These were all removed from  the pelvis through the right suprapubic port.  The hemostasis of the tube  was achieved with monopolar cautery at the end and after copious irrigation  and copious flushing of the lumen of the tube, hemostasis was noted to be  adequate.  All instruments were then removed under direct visualization as  the CO2 was allowed to escape after leaving approximately 50 mL of warm  lactated Ringer's in the pelvis.  The subumbilical incision was closed with  a fascial suture of 0 Vicryl and subcuticular sutures of 3-0 Vicryl.  The  right suprapubic incision was closed in a similar fashion with fascial and  subcuticular sutures.  The left 5 mm port was  closed with subcuticular  sutures of 3-0 Vicryl.   SPECIMENS TO PATHOLOGY:  Portions of ectopic pregnancy.   DISCUSSION:  The patient had received methotrexate prior to her surgery and  was extremely desirous of salvage of her affected tube.  Because of these  two facts, it was felt reasonable to do a linear salpingostomy with removal  of ectopic pregnancy.      VPH/MEDQ  D:  02/01/2005  T:  02/01/2005  Job:  [id number]

## 2011-05-07 NOTE — Op Note (Signed)
[place]  Patient:    Stefanie Baldwin, Stefanie Baldwin                  MRN: [id number] Proc. Date: 08/11/00 Adm. Date:  [id number] Attending:  Gustavus Messing CC:         Yaakov Guthrie. Shon Hough, M.D. x 2                           Operative Report  INDICATIONS:  A 40 year old with severe macromastia and back and shoulder pain secondary to large, pendulous breasts.  PROCEDURE:  Bilateral breast reductions using the inferior pedicle technique.  SURGEON:  Yaakov Guthrie. Shon Hough, M.D.  ASSISTANTTalbert Forest Case, RN  DESCRIPTION OF PROCEDURE:  The patient was setup and drawn for the inferior pedicle reduction mammoplasty, remarking the nipple areolar complexes back to 20 cm from the supersternal notch.  She then underwent general anesthesia intubated orally.  Prep was done to the chest and breast areas in the routine fashion using Betadine soap and solution and walled off with sterile towels and drapes so as to make a sterile field.  0.25% Xylocaine with epinephrine 1:400,000 concentration was injected locally for vasoconstriction.  The wounds were scored with a #15 blade.  The skin over the inferior pedicle was deepithalialized.  Medial and lateral fatty dermal pedicles were excised down the underlying pectoralis fascia.  Out laterally more tissue was taken to improve symmetry in the axillary area.  After proper hemostasis the new keyhole area was also debulked, the flaps were transposed and stayed with 3-0 Prolene.  Subcutaneous closure was done with 3-0 Monocryl x 2 layers and then a running subcuticular stitch of 3-0 Monocryl and 5-0 Monocryl throughout the inverted T.  The wounds were drained with #10 Blake drain, removal of 1000 grams per side.  The same procedure was carried out.  After the wounds were closed, Steri-Strips and soft dressings were applied including Xeroform, 4 x 4, ABDs, and hypafix tape.  She withstood the procedures very well and  was taken to the recovery room in good condition. DD:  08/11/00 TD:  08/11/00 Job: [id number]

## 2011-05-07 NOTE — H&P (Signed)
NAME:  Stefanie Baldwin, Stefanie Baldwin                ACCOUNT NO.:  [id number]   MEDICAL RECORD NO.:  [id number]          PATIENT TYPE:  AMB   LOCATION:  SDC                           [facility]   PHYSICIAN:  Hal Morales, M.D.DATE OF BIRTH:  [birth year]   DATE OF ADMISSION:  04/08/2007  DATE OF DISCHARGE:                              HISTORY & PHYSICAL   HISTORY OF PRESENT ILLNESS:  The patient is a 40 year old black single  female, para 0-0-1-0 who presents for laparoscopy for  ovarian  cystectomy and assessment for myomectomy of a potentially pedunculated  fibroid.  The patient has a long history of pelvic pain and irregular  menses and began her care in our practice in 2000 on referral from her  urologist.  She has been found since that time to have interstitial  cystitis and has managed that primarily with dietary changes.  She has  also had difficulty intermittently with ovarian cysts, and these have  generally resolved spontaneously.  She, however, was noted in March 2008  to have bilateral ovarian cysts, the largest of them 1.9 cm, and was  placed on continuous oral contraceptive pills.  She presented again  March 19, 2007, continuing to have significant pelvic pain daily.  At  this time, her one cyst had increased in size to 4.3 cm.  She had been  placed on continuous oral contraceptive pills but was having frequent  breakthrough bleeding and pain that was 8 to 9 on 10-point scale.  She  is taking ibuprofen daily.  She denies nausea, vomiting, constipation,  and diarrhea.  She likewise denies dysuria.  She wishes to proceed with  ovarian cystectomy.   At the time of her last ultrasound on March 19, 2007, there had been no  change in the size of her uterine fibroids, the largest of which had  been measured at 3.4 cm in February 2008.   PAST MEDICAL HISTORY:  The patient was diagnosed with interstitial  cystitis in 2005 and has been followed with conservative management.   OBSTETRICAL  HISTORY:  The patient had a single pregnancy in 2006 that  was a tubal ectopic.  She underwent methotrexate therapy but continued  to be symptomatic and underwent left salpingostomy on February 01, 2005.  GYN HISTORY  Contraception:  She has used oral contraceptives only for  contraception, she has never used an IUD.  She did have a followup  hysterosalpingogram after her left salpingostomy and was noted to have a  normal endometrial cavity and bilateral fill and spill of her fallopian  tubes.   SURGICAL HISTORY:  See above.   CURRENT MEDICATIONS:  Ovcon 35 and Advil.   DRUG ALLERGIES:  No known drug allergies.   FAMILY HISTORY:  Positive for thyroid disease with her mother having a  goiter, chronic hypertension in her mother, type 2 diabetes in maternal  grandmother and father, history of stroke in father.   REVIEW OF SYSTEMS:  Negative except as mentioned above.   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  She is married.  PHYSICAL EXAMINATION:  GENERAL:  The patient is a well developed black  female in no acute distress.  VITAL SIGNS:  Pulse 94, blood pressure 120/80.  Weight 230 pounds.  Height 5 feet 5 inches.  HEENT:  Within normal limits.  NECK:  Thyroid is normal.  LUNGS: Clear.  HEART:  Regular rate and rhythm.  ABDOMEN:  Soft without tenderness, masses, or organomegaly, though the  examination is somewhat compromised by patient habitus.  PELVIC:  EG/BUS within normal limits.  The vagina is rugose.  The cervix  is without gross lesions.  The uterus does not feel significantly  enlarged, but the uterine and adnexal examinations are compromised by  patient body habitus.  Rectovaginal reveals no rectovaginal septal  masses.   IMPRESSION:  1. Left ovarian cyst which has increased in size over the last two      months.  2. Pelvic pain, chronic in nature, now worsening with daily pain in      the 8 to 9/10 range requiring daily  medication.  3. History of small uterine fibroids.  4. No benefit to pelvic pain or ovarian cyst on continuous oral      contraceptives.  5. Nausea with oral contraceptives.  6. Increased body mass index.  7. History of ectopic pregnancy with documented bilateral tubal      patency and normal endometrial cavity.   DISPOSITION:  A discussion is held with the patient concerning options  for management of her ovarian cyst.  She was offered Lupron therapy but  declined.  Laparoscopic ovarian cystectomy was reviewed and recommended.  She also wished assessment for laparoscopic myomectomy, though the  benefit of this procedure is uncertain unless one of her fibroids is  actually pedunculated.  That will be the circumstance under which a  myomectomy will be pursued.  Otherwise, she will undergo a laparoscopic  ovarian cystectomy at [facility] on April 08, 2007.  The risks of  anesthesia, bleeding, infection, damage to adjacent organs, and possible  need for a laparotomy are all reviewed.  The patient acknowledges  understanding and wishes to proceed.      Hal Morales, M.D.  Electronically Signed     VPH/MEDQ  D:  04/05/2007  T:  04/05/2007  Job:  [id number]

## 2011-08-08 ENCOUNTER — Other Ambulatory Visit: Payer: Self-pay | Admitting: Family Medicine

## 2011-08-08 DIAGNOSIS — E010 Iodine-deficiency related diffuse (endemic) goiter: Secondary | ICD-10-CM

## 2011-08-21 ENCOUNTER — Ambulatory Visit
Admission: RE | Admit: 2011-08-21 | Discharge: 2011-08-21 | Disposition: A | Discharge status: Home / Self Care | Payer: Private Health Insurance - Indemnity | Source: Ambulatory Visit | Attending: Family Medicine | Admitting: Family Medicine

## 2011-08-21 DIAGNOSIS — E010 Iodine-deficiency related diffuse (endemic) goiter: Secondary | ICD-10-CM

## 2011-09-06 LAB — HCG, QUANTITATIVE, PREGNANCY: hCG, Beta Chain, Quant, S: 2069 — ABNORMAL HIGH

## 2011-09-10 LAB — URINALYSIS, ROUTINE W REFLEX MICROSCOPIC
Bilirubin Urine: NEGATIVE
Glucose, UA: NEGATIVE
Ketones, ur: NEGATIVE
Nitrite: NEGATIVE
Specific Gravity, Urine: 1.01
pH: 6.5

## 2011-09-10 LAB — URINE CULTURE

## 2011-09-10 LAB — DIFFERENTIAL
Basophils Absolute: 0
Eosinophils Relative: 1
Lymphocytes Relative: 13
Monocytes Absolute: 0.2
Monocytes Relative: 2 — ABNORMAL LOW
Neutro Abs: 7.9 — ABNORMAL HIGH

## 2011-09-10 LAB — CBC
HCT: 41.5
Hemoglobin: 14.4
RBC: 4.57
RDW: 14.1

## 2011-09-10 LAB — URINE MICROSCOPIC-ADD ON

## 2011-09-11 LAB — DIFFERENTIAL
Basophils Absolute: 0
Basophils Relative: 0
Eosinophils Absolute: 0.1
Eosinophils Relative: 1
Neutrophils Relative %: 85 — ABNORMAL HIGH

## 2011-09-11 LAB — RPR: RPR Ser Ql: NONREACTIVE

## 2011-09-11 LAB — URINE CULTURE: Colony Count: 80000

## 2011-09-11 LAB — CBC
HCT: 31.5 — ABNORMAL LOW
HCT: 39.3
HCT: 39.7
Hemoglobin: 11.1 — ABNORMAL LOW
MCV: 91.3
Platelets: 239
Platelets: 248
RBC: 4.32
RDW: 13.3
RDW: 13.3
WBC: 10.3
WBC: 10.5

## 2011-09-11 LAB — STREP B DNA PROBE: Strep Group B Ag: NEGATIVE

## 2011-09-11 LAB — WET PREP, GENITAL: Clue Cells Wet Prep HPF POC: NONE SEEN

## 2011-09-11 LAB — URINALYSIS, ROUTINE W REFLEX MICROSCOPIC
Glucose, UA: NEGATIVE
Ketones, ur: NEGATIVE
Protein, ur: NEGATIVE
pH: 6.5

## 2011-09-11 LAB — URINE MICROSCOPIC-ADD ON

## 2012-11-13 ENCOUNTER — Other Ambulatory Visit: Payer: Self-pay | Admitting: Obstetrics & Gynecology

## 2012-11-13 DIAGNOSIS — Z9889 Other specified postprocedural states: Secondary | ICD-10-CM

## 2012-11-13 DIAGNOSIS — Z1231 Encounter for screening mammogram for malignant neoplasm of breast: Secondary | ICD-10-CM

## 2013-08-09 ENCOUNTER — Other Ambulatory Visit: Payer: Self-pay | Admitting: Family Medicine

## 2013-08-09 DIAGNOSIS — E041 Nontoxic single thyroid nodule: Secondary | ICD-10-CM

## 2013-08-13 ENCOUNTER — Other Ambulatory Visit: Payer: Private Health Insurance - Indemnity

## 2013-09-01 ENCOUNTER — Ambulatory Visit
Admission: RE | Admit: 2013-09-01 | Discharge: 2013-09-01 | Disposition: A | Discharge status: Home / Self Care | Payer: Managed Care, Other (non HMO) | Source: Ambulatory Visit | Attending: Family Medicine | Admitting: Family Medicine

## 2013-09-01 DIAGNOSIS — E041 Nontoxic single thyroid nodule: Secondary | ICD-10-CM

## 2013-10-11 ENCOUNTER — Other Ambulatory Visit: Payer: Self-pay

## 2013-10-11 DIAGNOSIS — Z9889 Other specified postprocedural states: Secondary | ICD-10-CM

## 2013-10-11 DIAGNOSIS — Z1231 Encounter for screening mammogram for malignant neoplasm of breast: Secondary | ICD-10-CM

## 2013-11-29 ENCOUNTER — Other Ambulatory Visit: Payer: Self-pay

## 2013-11-29 DIAGNOSIS — Z1231 Encounter for screening mammogram for malignant neoplasm of breast: Secondary | ICD-10-CM

## 2014-05-24 ENCOUNTER — Ambulatory Visit
Admission: RE | Admit: 2014-05-24 | Discharge: 2014-05-24 | Disposition: A | Discharge status: Home / Self Care | Payer: Managed Care, Other (non HMO) | Source: Ambulatory Visit

## 2014-05-24 DIAGNOSIS — Z1231 Encounter for screening mammogram for malignant neoplasm of breast: Secondary | ICD-10-CM

## 2014-06-07 ENCOUNTER — Encounter (HOSPITAL_COMMUNITY): Payer: Self-pay | Admitting: Emergency Medicine

## 2014-06-07 ENCOUNTER — Emergency Department (HOSPITAL_COMMUNITY)
Admission: EM | Admit: 2014-06-07 | Discharge: 2014-06-07 | Disposition: A | Discharge status: Home / Self Care | Payer: Managed Care, Other (non HMO) | Source: Home / Self Care | Attending: Family Medicine | Admitting: Family Medicine

## 2014-06-07 DIAGNOSIS — L819 Disorder of pigmentation, unspecified: Secondary | ICD-10-CM | POA: Diagnosis present

## 2014-06-07 NOTE — ED Provider Notes (Signed)
CSN: [id number]     Arrival date & time 06/07/14  1836 History   First MD Initiated Contact with Patient 06/07/14 1909     Chief Complaint  Patient presents with  . Skin Discoloration   (Consider location/radiation/quality/duration/timing/severity/associated sxs/prior Treatment) HPI  Hyperpigmentation of feet bialterally - located only on the arch of her soles, she thinks it started when she went to [facility] for vacation, she returned on Saturday and is concerned about a "flesh-eating bacteria" that was in the water in [place]; she denies redness, pain, blistering, or swelling of these areas of the skin; she also denies fever or chills; she does have a runny nose and some congestion which he thinks is due to a cold virus  History reviewed. No pertinent past medical history. No past surgical history on file. No family history on file. History  Substance Use Topics  . Smoking status: Not on file  . Smokeless tobacco: Not on file  . Alcohol Use: Not on file   OB History   Grav Para Term Preterm Abortions TAB SAB Ect Mult Living                 Review of Systems Positive for skin discoloration, runny nose and cough Negative for fever, chills, blisters, skin redness    Allergies  Review of patient's allergies indicates no known allergies.  Home Medications   Prior to Admission medications   Not on File   BP 112/81  Pulse 112  Temp(Src) 98.6 F (37 C)  SpO2 97%  LMP 05/17/2014 Physical Exam Gen: healthy, well appearing AAF, pleasant Skin: hyperpigmentation on soles bilaterally, no tenderness, erythema, pustules or abrasion Neuro: sensation intact to LE bilaterally  ED Course  Procedures (including critical care time) Labs Review Labs Reviewed - No data to display  Imaging Review No results found.   MDM   1. Discoloration of skin of foot    Unknown etiology but no evidence of bacterial infection. Continue to monitor and given precaution for return.      Angelica Ran, MD 06/07/14 [phone]

## 2014-06-07 NOTE — ED Notes (Signed)
C/o skin discoloration on bilateral arch of feet Did go to [place] where it know for there bacteria in water Legs are swollen states she does run track

## 2014-06-07 NOTE — Discharge Instructions (Signed)
There is no evidence of a bacterial infection of your scan at this time. I do not know the calls of the change in the color of your feet. It could be related to the sun. Please continue to monitor this. He develop any redness, blisters, skin breakdown or pain, then come back immediately for treatment. Otherwise followup with her dermatologist.  Daycare,  Dr. Maricela Bo

## 2014-06-08 NOTE — ED Provider Notes (Signed)
Medical screening examination/treatment/procedure(s) were performed by a resident physician and as supervising physician I was immediately available for consultation/collaboration.  Philipp Deputy, M.D.  Harden Mo, MD 06/08/14 [phone]

## 2014-10-31 ENCOUNTER — Other Ambulatory Visit: Payer: Self-pay | Admitting: Orthopedic Surgery

## 2015-03-23 ENCOUNTER — Other Ambulatory Visit: Payer: Self-pay | Admitting: Family Medicine

## 2015-03-23 DIAGNOSIS — E010 Iodine-deficiency related diffuse (endemic) goiter: Secondary | ICD-10-CM

## 2015-03-30 ENCOUNTER — Ambulatory Visit
Admission: RE | Admit: 2015-03-30 | Discharge: 2015-03-30 | Disposition: A | Discharge status: Home / Self Care | Payer: Managed Care, Other (non HMO) | Source: Ambulatory Visit | Attending: Family Medicine | Admitting: Family Medicine

## 2015-03-30 DIAGNOSIS — E010 Iodine-deficiency related diffuse (endemic) goiter: Secondary | ICD-10-CM

## 2015-06-14 ENCOUNTER — Encounter: Payer: Self-pay | Admitting: Podiatry

## 2015-06-14 ENCOUNTER — Ambulatory Visit (INDEPENDENT_AMBULATORY_CARE_PROVIDER_SITE_OTHER): Payer: Managed Care, Other (non HMO) | Admitting: Podiatry

## 2015-06-14 VITALS — BP 128/79 | HR 92 | Ht 65.0 in | Wt 245.0 lb

## 2015-06-14 DIAGNOSIS — M21969 Unspecified acquired deformity of unspecified lower leg: Secondary | ICD-10-CM | POA: Diagnosis not present

## 2015-06-14 DIAGNOSIS — R234 Changes in skin texture: Secondary | ICD-10-CM

## 2015-06-14 DIAGNOSIS — M21962 Unspecified acquired deformity of left lower leg: Secondary | ICD-10-CM | POA: Diagnosis not present

## 2015-06-14 NOTE — Progress Notes (Signed)
Subjective: 44 year old female presents complaining of pain in left big toe joint x 1 month. Does Zumba exercise and feet are hurting. The same area had broken toe and got it fixed 10-15 years ago.  Review of Systems - Negative except chief complaints.  Objective: Dermatologic: Normal skin with hard enlarged fibrous scar over the proximal phalanx left. Area is flat without edema or erythema.  Calluse under 5th MPJ bilateral. Vascular: All pedal pulses are palpable bilateral. Neurologic: All epicritic and tactile sensations grossly intact. Orthopedic: Positive of short first and elevated metatarsal bilateral. Forefoot varus bilateral.  X-ray examination of the left foot show status post wire fixation at left proximal phalanx as seen in routine [place] osteotomy of great toe.  Positive of short elevated first ray left.   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 Eschar formation from irritated old scar over left great toe.  Forefoot varus with plantar callus under 5th bilateral.  Plan: Reviewed clinical findings and available treatment options. Both calluses debrided. Patient will return for treatment options.

## 2015-06-14 NOTE — Patient Instructions (Signed)
Left toe pain possible due to excess motion at the first MPJ and shoe irritation. Debrided callus under 5th MPJ bilateral. May benefit from [place] osteotomy with bone graft to plantar flex the first ray, possible wire removal if indicated. Return as needed.

## 2017-01-13 ENCOUNTER — Encounter: Payer: Self-pay | Admitting: Internal Medicine

## 2017-01-15 ENCOUNTER — Encounter (INDEPENDENT_AMBULATORY_CARE_PROVIDER_SITE_OTHER): Payer: Self-pay

## 2017-01-15 ENCOUNTER — Ambulatory Visit (INDEPENDENT_AMBULATORY_CARE_PROVIDER_SITE_OTHER): Payer: Managed Care, Other (non HMO) | Admitting: Internal Medicine

## 2017-01-15 ENCOUNTER — Encounter: Payer: Self-pay | Admitting: Internal Medicine

## 2017-01-15 VITALS — BP 110/74 | HR 104 | Ht 65.0 in | Wt 240.1 lb

## 2017-01-15 DIAGNOSIS — K5900 Constipation, unspecified: Secondary | ICD-10-CM

## 2017-01-15 DIAGNOSIS — K219 Gastro-esophageal reflux disease without esophagitis: Secondary | ICD-10-CM

## 2017-01-15 DIAGNOSIS — R195 Other fecal abnormalities: Secondary | ICD-10-CM | POA: Diagnosis not present

## 2017-01-15 MED ORDER — NA SULFATE-K SULFATE-MG SULF 17.5-3.13-1.6 GM/177ML PO SOLN: 1.0000 | Freq: Once | ORAL | 0 refills | Status: AC

## 2017-01-15 NOTE — Progress Notes (Signed)
HISTORY OF PRESENT ILLNESS:  Stefanie Baldwin is a 46 y.o. female, [name] employee, with no significant past medical history aside from obesity who is sent today by her primary care provider [name] ([facility]) with chief complaint of dark stools. Patient describes chronic constipation for which she takes Dulcolax approximate once per month. Occasional anal discomfort. She also has intermittent reflux symptoms. In recent weeks patient has noticed darker black specks associated with her stool. She shows me a picture on her cell phone. Has not had a bowel movement in 3 days. Saw her PCP who performed blood work. Comprehensive metabolic panel and CBC were normal with hemoglobin 15.2. She has had some minor rectal bleeding at times. No family history of colon cancer. No prior GI evaluations. She does report 13 pound weight loss which she attributes to diet.  REVIEW OF SYSTEMS:  All non-GI ROS negative except for hematuria, sinus and allergy, menstrual cramps, eczema  Past Medical History:  Diagnosis Date  . Obesity   . Pneumonia     Past Surgical History:  Procedure Laterality Date  . CYST REMOVAL HAND     wrist  . FOOT SURGERY    . OVARIAN CYST REMOVAL      Social History Stefanie Baldwin  reports that she has never smoked. She has never used smokeless tobacco. She reports that she does not drink alcohol or use drugs.  family history includes Diabetes in her father and mother; Heart disease in her mother; Hypertension in her mother; Thyroid disease in her brother and mother.  No Known Allergies     PHYSICAL EXAMINATION: Vital signs: BP 110/74   Pulse (!) 104   Ht 5\' 5"  (1.651 m)   Wt 240 lb 2 oz (108.9 kg)   BMI 39.96 kg/m   Constitutional:Pleasant, generally well-appearing, no acute distress Psychiatric: alert and oriented x3, cooperative Eyes: extraocular movements intact, anicteric, conjunctiva pink Mouth: oral pharynx moist, no lesions Neck: supple  no lymphadenopathy Cardiovascular: heart regular rate and rhythm, no murmur Lungs: clear to auscultation bilaterally Abdomen: soft,Obese, nontender, nondistended, no obvious ascites, no peritoneal signs, normal bowel sounds, no organomegaly Rectal:Deferred until colonoscopy Extremities: no clubbing cyanosis or lower extremity edema bilaterally Skin: no lesions on visible extremities Neuro: No focal deficits. Cranial nerves intact  ASSESSMENT:  #1. Dark stools. Stools indeed quite dark on image presented. Not clear if this represents blood or not. Needs further evaluation #2. Chronic constipation #3. GERD without alarm symptoms #4. Obesity  PLAN:  #1. Schedule colonoscopy and upper endoscopy to evaluate dark stools and rule out GI mucosal pathology. She would be due for routine screening colonoscopy at this time given her age and race. Upper endoscopy reasonable as well given intermittent GERD symptoms.The nature of the procedure, as well as the risks, benefits, and alternatives were carefully and thoroughly reviewed with the patient. Ample time for discussion and questions allowed. The patient understood, was satisfied, and agreed to proceed. #2. Reflux precautions with attention to weight loss #3. Daily fiber with Metamucil  A copy of this consultation note has been sent to Dr. Luvenia Starch

## 2017-01-15 NOTE — Patient Instructions (Signed)

## 2017-01-23 ENCOUNTER — Encounter: Payer: Self-pay | Admitting: Internal Medicine

## 2017-01-24 ENCOUNTER — Telehealth: Payer: Self-pay | Admitting: Internal Medicine

## 2017-01-24 NOTE — Telephone Encounter (Signed)
Spoke with patient and she had questions about the advanced directive.  She was wondering if it would be okay if she didn't accept blood products.  I told her that we would honor her wish, but we didn't give blood here anyway.  I told her that if something were to go wrong that she would be transported to [facility].  She was fine with that, and stated that she would proceed with her procedure.

## 2017-01-30 ENCOUNTER — Telehealth: Payer: Self-pay | Admitting: Internal Medicine

## 2017-01-30 NOTE — Telephone Encounter (Signed)
Yes. Need to make sure there is not an underlying problem that caused dark stools in the first place

## 2017-01-30 NOTE — Telephone Encounter (Signed)
Spoke with pt and she is aware.

## 2017-01-30 NOTE — Telephone Encounter (Signed)
Pt states that her stool is back to normal, no longer has dark specks in it. States it cleared up about a week and a half ago. Pt wants to know if she still needs to have ECL done. Please advise.

## 2017-02-06 ENCOUNTER — Encounter: Payer: Managed Care, Other (non HMO) | Admitting: Internal Medicine

## 2017-02-25 ENCOUNTER — Other Ambulatory Visit: Payer: Self-pay | Admitting: Internal Medicine

## 2017-02-25 DIAGNOSIS — Z1231 Encounter for screening mammogram for malignant neoplasm of breast: Secondary | ICD-10-CM

## 2017-02-28 ENCOUNTER — Ambulatory Visit
Admission: RE | Admit: 2017-02-28 | Discharge: 2017-02-28 | Disposition: A | Discharge status: Home / Self Care | Payer: 59 | Source: Ambulatory Visit | Attending: Internal Medicine | Admitting: Internal Medicine

## 2017-02-28 DIAGNOSIS — Z1231 Encounter for screening mammogram for malignant neoplasm of breast: Secondary | ICD-10-CM

## 2017-05-21 ENCOUNTER — Ambulatory Visit: Payer: Managed Care, Other (non HMO) | Admitting: Obstetrics & Gynecology

## 2017-05-23 ENCOUNTER — Encounter: Payer: Self-pay | Admitting: Obstetrics & Gynecology

## 2017-05-23 ENCOUNTER — Ambulatory Visit (INDEPENDENT_AMBULATORY_CARE_PROVIDER_SITE_OTHER): Payer: 59 | Admitting: Obstetrics & Gynecology

## 2017-05-23 VITALS — BP 128/76

## 2017-05-23 DIAGNOSIS — N914 Secondary oligomenorrhea: Secondary | ICD-10-CM | POA: Diagnosis not present

## 2017-05-23 DIAGNOSIS — R102 Pelvic and perineal pain: Secondary | ICD-10-CM | POA: Diagnosis not present

## 2017-05-23 LAB — CBC
HEMATOCRIT: 41.4 % (ref 35.0–45.0)
Hemoglobin: 13.9 g/dL (ref 11.7–15.5)
MCH: 29.8 pg (ref 27.0–33.0)
MCHC: 33.6 g/dL (ref 32.0–36.0)
MCV: 88.7 fL (ref 80.0–100.0)
MPV: 9.7 fL (ref 7.5–12.5)
PLATELETS: 267 10*3/uL (ref 140–400)
RBC: 4.67 MIL/uL (ref 3.80–5.10)
RDW: 14.1 % (ref 11.0–15.0)
WBC: 6 10*3/uL (ref 3.8–10.8)

## 2017-05-23 LAB — URINALYSIS W MICROSCOPIC + REFLEX CULTURE
Bilirubin Urine: NEGATIVE
Casts: NONE SEEN [LPF]
Crystals: NONE SEEN [HPF]
Glucose, UA: NEGATIVE
Ketones, ur: NEGATIVE
LEUKOCYTES UA: NEGATIVE
NITRITE: NEGATIVE
WBC, UA: NONE SEEN WBC/HPF (ref <=5)
YEAST: NONE SEEN [HPF]
pH: 5.5 (ref 5.0–8.0)

## 2017-05-23 LAB — PREGNANCY, URINE: PREG TEST UR: NEGATIVE

## 2017-05-23 LAB — TSH: TSH: 1.72 m[IU]/L

## 2017-05-23 MED ORDER — MEDROXYPROGESTERONE ACETATE 5 MG PO TABS: 5.0000 mg | ORAL_TABLET | Freq: Every day | ORAL | 0 refills | Status: DC

## 2017-05-23 NOTE — Addendum Note (Signed)
Addended by: Thurnell Garbe on: 05/23/2017 10:28 AM   Modules accepted: Orders

## 2017-05-23 NOTE — Progress Notes (Addendum)
    Stefanie Baldwin May 11, 1971 [id number]        46 y.o.  G2P0010 Married, Vasectomy  RP:  No menses x 3 months and lower abdominal pain  HPI:  Had regular menses qmonth, normal flow until 3 months ago.  No vaginal bleeding since.  Normal vaginal secretions.  Mild pelvic discomfort.  No hot flashes, no night sweats.  Occasional mild SUI.  BMs wnl.   Past medical history,surgical history, problem list, medications, allergies, family history and social history were all reviewed and documented in the EPIC chart.  Directed ROS with pertinent positives and negatives documented in the history of present illness/assessment and plan.  Exam:  Vitals:   05/23/17 0933  BP: 128/76   General appearance:  Normal  Abdo:  Soft, NT, not distended, no mass felt.  Gyn:  Vulva normal.             Bimanual:  AV Uterus normal volume, mobile, NT.  No adnexal mass felt.  Normal secretions.  UPT neg U/A Nit neg, No leuko.  Blood 2+, Protein 1+.   Assessment/Plan:  46 y.o. G2P0010   1. Secondary oligomenorrhea No menses x 3 months with pelvic discomfort.  Perimenopausal?  R/O Endocrine cause.  Evaluate Endometrium.  Will bring a withdrawal bleeding with Provera, then f/u for Pelvic US. - FSH - TSH - Prolactin - US Transvaginal Non-OB; Future  2. Female pelvic pain Normal Gyn exam.  Declines STI screen.  R/O Cystitis.  F/U pelvic US to assess Uterus and Ovaries. - U/A Nit neg, Leuko neg.  But Blood 2+ and Protein 1+.  Will await U. Culture before treating.  Repeat U/A at f/u in 2 wks to recheck on blood and protein. - CBC - US Transvaginal Non-OB; Future  Counseling on above issues >50% x 25 minutes.  [name] MD, 9:43 AM 05/23/2017

## 2017-05-23 NOTE — Patient Instructions (Signed)
1. Secondary oligomenorrhea No menses x 3 months with pelvic discomfort.  Perimenopausal?  R/O Endocrine cause.  Evaluate Endometrium.  Will bring a withdrawal bleeding with Provera, then f/u for Pelvic US. - FSH - TSH - Prolactin - US Transvaginal Non-OB; Future  2. Female pelvic pain Normal Gyn exam.  Declines STI screen.  R/O Cystitis.  F/U pelvic US to assess Uterus and Ovaries. - U/A - CBC - US Transvaginal Non-OB; Future  Stefanie Baldwin, it was a pleasure to see you today!  I will inform you of your results as soon as available.  See you back in 2 weeks.

## 2017-05-24 LAB — URINE CULTURE

## 2017-05-24 LAB — PROLACTIN: PROLACTIN: 12.7 ng/mL

## 2017-05-24 LAB — FOLLICLE STIMULATING HORMONE: FSH: 9.4 m[IU]/mL

## 2017-06-04 ENCOUNTER — Other Ambulatory Visit: Payer: 59

## 2017-06-04 ENCOUNTER — Ambulatory Visit: Payer: 59 | Admitting: Obstetrics & Gynecology

## 2017-07-24 ENCOUNTER — Ambulatory Visit (INDEPENDENT_AMBULATORY_CARE_PROVIDER_SITE_OTHER): Payer: 59 | Admitting: Podiatry

## 2017-07-24 ENCOUNTER — Encounter: Payer: Self-pay | Admitting: Podiatry

## 2017-07-24 DIAGNOSIS — M21969 Unspecified acquired deformity of unspecified lower leg: Secondary | ICD-10-CM | POA: Diagnosis not present

## 2017-07-24 DIAGNOSIS — R262 Difficulty in walking, not elsewhere classified: Secondary | ICD-10-CM | POA: Diagnosis not present

## 2017-07-24 DIAGNOSIS — M722 Plantar fascial fibromatosis: Secondary | ICD-10-CM

## 2017-07-24 MED ORDER — IBUPROFEN 800 MG PO TABS: 800.0000 mg | ORAL_TABLET | Freq: Three times a day (TID) | ORAL | 1 refills | Status: DC | PRN

## 2017-07-24 NOTE — Patient Instructions (Signed)
Seen for pain in left heel. Noted of weak first metatarsal bone. Rx Motrin 800 mg prescribed. Night Splint dispensed. Placed in CAM walker. Return for custom orthotics.

## 2017-07-24 NOTE — Progress Notes (Signed)
  Subjective: 46 year old female presents complaining of pain in left heel. Stated that she had cortisone injection from [place] about a year ago and worked for [employer].  Injection made it worse for a week before it helped. Now she wishes to avoid injection if possible. At this time pain is back in left heel. Been hurting for several days. Used to hurt only in the morning but now hurts everyday unable to walk. She returned to her Zumba exercise a month ago, but had to stop soon after which was about 3 weeks ago.  Review of Systems - Negative except chief complaints.  Objective: Dermatologic: No open skin lesions. Calluse under 5th MPJ bilateral. Vascular: All pedal pulses are palpable bilateral. Neurologic: All epicritic and tactile sensations grossly intact. Orthopedic: Positive of short first and elevated metatarsal bilateral. Forefoot varus bilateral.  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 Plantar fasciitis left heel. Metatarsal deformity bilateral.  Plan: Reviewed clinical findings and available treatment options. Patient wants to try CAM walker first before getting Cortisone injections. Night Splint dispensed with instructions. Rx Motrin 800 mg e scribed. Return in 2 weeks for follow up and to prepare for Orthotics.

## 2017-08-06 ENCOUNTER — Other Ambulatory Visit: Payer: 59

## 2017-08-06 ENCOUNTER — Ambulatory Visit: Payer: 59 | Admitting: Obstetrics & Gynecology

## 2017-08-06 DIAGNOSIS — Z0289 Encounter for other administrative examinations: Secondary | ICD-10-CM

## 2017-08-07 ENCOUNTER — Ambulatory Visit: Payer: 59 | Admitting: Podiatry

## 2017-11-19 ENCOUNTER — Encounter (HOSPITAL_BASED_OUTPATIENT_CLINIC_OR_DEPARTMENT_OTHER): Payer: Self-pay | Admitting: *Deleted

## 2017-11-19 ENCOUNTER — Other Ambulatory Visit: Payer: Self-pay

## 2017-11-21 NOTE — H&P (Signed)
Stefanie Baldwin is an 46 y.o. female.   Chief Complaint: Enlarging mass posterior scalp HPI: Increase headaches neck spasm  Past Medical History:  Diagnosis Date  . Complication of anesthesia   . Ectopic pregnancy   . GERD (gastroesophageal reflux disease)    uses OTC meds prn  . Mass    on neck and chest  . Obesity   . Pneumonia   . PONV (postoperative nausea and vomiting)    "severe" PNOV    Past Surgical History:  Procedure Laterality Date  . CYST REMOVAL HAND     wrist  . FOOT SURGERY    . OVARIAN CYST REMOVAL      Family History  Problem Relation Age of Onset  . Thyroid disease Mother   . Diabetes Mother   . Hypertension Mother   . Heart disease Mother   . Diabetes Father   . Thyroid disease Brother   . Colon cancer Neg Hx   . Stomach cancer Neg Hx   . Rectal cancer Neg Hx   . Esophageal cancer Neg Hx   . Liver cancer Neg Hx   . Breast cancer Neg Hx    Social History:  reports that  has never smoked. she has never used smokeless tobacco. She reports that she does not drink alcohol or use drugs.  Allergies: No Known Allergies  No medications prior to admission.    No results found for this or any previous visit (from the past 48 hour(s)). No results found.  Review of Systems  Constitutional: Negative.   HENT: Negative.   Eyes: Negative.   Respiratory: Negative.   Cardiovascular: Negative.   Gastrointestinal: Negative.   Genitourinary: Negative.   Musculoskeletal: Positive for neck pain.  Skin: Negative.   Neurological: Negative.   Endo/Heme/Allergies: Negative.   Psychiatric/Behavioral: Negative.     Height 5\' 5"  (1.651 m), weight 113.4 kg (250 lb), last menstrual period 11/10/2017. Physical Exam   Assessment/Plan Mass posterior scalp for exciasion with liposuction assistance Stefanie Baldwin L, MD 11/21/2017, 2:08 PM

## 2017-11-28 NOTE — H&P (Signed)
Stefanie Baldwin is an 46 y.o. female.   Chief Complaint:Large masses back of neck and chest HPI: Increased growthof masses and pain  Past Medical History:  Diagnosis Date  . Complication of anesthesia   . Ectopic pregnancy   . GERD (gastroesophageal reflux disease)    uses OTC meds prn  . Mass    on neck and chest  . Obesity   . Pneumonia   . PONV (postoperative nausea and vomiting)    "severe" PNOV    Past Surgical History:  Procedure Laterality Date  . CYST REMOVAL HAND     wrist  . FOOT SURGERY    . OVARIAN CYST REMOVAL      Family History  Problem Relation Age of Onset  . Thyroid disease Mother   . Diabetes Mother   . Hypertension Mother   . Heart disease Mother   . Diabetes Father   . Thyroid disease Brother   . Colon cancer Neg Hx   . Stomach cancer Neg Hx   . Rectal cancer Neg Hx   . Esophageal cancer Neg Hx   . Liver cancer Neg Hx   . Breast cancer Neg Hx    Social History:  reports that  has never smoked. she has never used smokeless tobacco. She reports that she does not drink alcohol or use drugs.  Allergies: No Known Allergies  No medications prior to admission.    No results found for this or any previous visit (from the past 48 hour(s)). No results found.  Review of Systems  Constitutional: Negative.   HENT: Negative.   Eyes: Negative.   Respiratory: Negative.   Gastrointestinal: Negative.   Genitourinary: Negative.   Musculoskeletal: Negative.   Skin: Negative.   Neurological: Negative.   Endo/Heme/Allergies: Negative.   Psychiatric/Behavioral: Negative.     Height 5\' 5"  (1.651 m), weight 113.4 kg (250 lb), last menstrual period 11/10/2017. 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

## 2017-12-01 ENCOUNTER — Encounter (HOSPITAL_BASED_OUTPATIENT_CLINIC_OR_DEPARTMENT_OTHER): Payer: Self-pay

## 2017-12-01 ENCOUNTER — Ambulatory Visit (HOSPITAL_BASED_OUTPATIENT_CLINIC_OR_DEPARTMENT_OTHER)
Admission: RE | Admit: 2017-12-01 | Discharge: 2017-12-01 | Disposition: A | Discharge status: Home / Self Care | Payer: 59 | Source: Ambulatory Visit | Attending: Specialist | Admitting: Specialist

## 2017-12-01 ENCOUNTER — Other Ambulatory Visit: Payer: Self-pay

## 2017-12-01 ENCOUNTER — Encounter (HOSPITAL_BASED_OUTPATIENT_CLINIC_OR_DEPARTMENT_OTHER)
Admission: RE | Disposition: A | Discharge status: Home / Self Care | Payer: Self-pay | Source: Ambulatory Visit | Attending: Specialist

## 2017-12-01 ENCOUNTER — Encounter (HOSPITAL_BASED_OUTPATIENT_CLINIC_OR_DEPARTMENT_OTHER): Payer: Self-pay | Admitting: Anesthesiology

## 2017-12-01 DIAGNOSIS — Z6841 Body Mass Index (BMI) 40.0 and over, adult: Secondary | ICD-10-CM | POA: Diagnosis not present

## 2017-12-01 DIAGNOSIS — R220 Localized swelling, mass and lump, head: Secondary | ICD-10-CM | POA: Diagnosis present

## 2017-12-01 DIAGNOSIS — D170 Benign lipomatous neoplasm of skin and subcutaneous tissue of head, face and neck: Secondary | ICD-10-CM | POA: Insufficient documentation

## 2017-12-01 DIAGNOSIS — E669 Obesity, unspecified: Secondary | ICD-10-CM | POA: Diagnosis not present

## 2017-12-01 HISTORY — DX: Other specified postprocedural states: Z98.890

## 2017-12-01 HISTORY — DX: Adverse effect of unspecified anesthetic, initial encounter: T41.45XA

## 2017-12-01 HISTORY — PX: LIPOSUCTION: SHX10

## 2017-12-01 HISTORY — DX: Localized swelling, mass and lump, unspecified: R22.9

## 2017-12-01 HISTORY — DX: Reserved for concepts with insufficient information to code with codable children: IMO0002

## 2017-12-01 HISTORY — DX: Gastro-esophageal reflux disease without esophagitis: K21.9

## 2017-12-01 HISTORY — DX: Nausea with vomiting, unspecified: R11.2

## 2017-12-01 HISTORY — PX: EXCISION MASS NECK: SHX6703

## 2017-12-01 HISTORY — DX: Other complications of anesthesia, initial encounter: T88.59XA

## 2017-12-01 SURGERY — EXCISION, MASS, NECK
Anesthesia: General | Site: Neck

## 2017-12-01 MED ORDER — CEFAZOLIN SODIUM-DEXTROSE 2-4 GM/100ML-% IV SOLN: 2.0000 g | INTRAVENOUS | Status: DC

## 2017-12-01 MED ORDER — DEXAMETHASONE SODIUM PHOSPHATE 4 MG/ML IJ SOLN
INTRAMUSCULAR | Status: DC | PRN
  Administered 2017-12-01: 10 mg via INTRAVENOUS

## 2017-12-01 MED ORDER — SUGAMMADEX SODIUM 500 MG/5ML IV SOLN
INTRAVENOUS | Status: DC | PRN
  Administered 2017-12-01: 200 mg via INTRAVENOUS

## 2017-12-01 MED ORDER — CHLORHEXIDINE GLUCONATE CLOTH 2 % EX PADS: 6.0000 | MEDICATED_PAD | Freq: Once | CUTANEOUS | Status: DC

## 2017-12-01 MED ORDER — LACTATED RINGERS IV SOLN
INTRAVENOUS | Status: AC | PRN
  Administered 2017-12-01: 500 mL via INTRAVENOUS

## 2017-12-01 MED ORDER — LACTATED RINGERS IV SOLN
INTRAVENOUS | Status: DC
  Administered 2017-12-01: 11:00:00 via INTRAVENOUS
  Administered 2017-12-01: 10 mL/h via INTRAVENOUS

## 2017-12-01 MED ORDER — SCOPOLAMINE 1 MG/3DAYS TD PT72
1.0000 | MEDICATED_PATCH | Freq: Once | TRANSDERMAL | Status: DC | PRN
  Administered 2017-12-01: 1.5 mg via TRANSDERMAL

## 2017-12-01 MED ORDER — LIDOCAINE HCL (CARDIAC) 20 MG/ML IV SOLN
INTRAVENOUS | Status: DC | PRN
  Administered 2017-12-01: 50 mg via INTRAVENOUS

## 2017-12-01 MED ORDER — EPINEPHRINE PF 1 MG/ML IJ SOLN
INTRAMUSCULAR | Status: DC | PRN
  Administered 2017-12-01: .5 mg via SUBCUTANEOUS

## 2017-12-01 MED ORDER — LIDOCAINE HCL 2 % IJ SOLN
INTRAMUSCULAR | Status: AC
  Filled 2017-12-01: qty 100

## 2017-12-01 MED ORDER — MIDAZOLAM HCL 2 MG/2ML IJ SOLN
1.0000 mg | INTRAMUSCULAR | Status: DC | PRN
  Administered 2017-12-01: 2 mg via INTRAVENOUS

## 2017-12-01 MED ORDER — SCOPOLAMINE 1 MG/3DAYS TD PT72
MEDICATED_PATCH | TRANSDERMAL | Status: AC
  Filled 2017-12-01: qty 1

## 2017-12-01 MED ORDER — LIDOCAINE HCL (PF) 2 % IJ SOLN
INTRAMUSCULAR | Status: DC | PRN
  Administered 2017-12-01: 50 mL

## 2017-12-01 MED ORDER — ROCURONIUM BROMIDE 100 MG/10ML IV SOLN
INTRAVENOUS | Status: DC | PRN
  Administered 2017-12-01: 50 mg via INTRAVENOUS

## 2017-12-01 MED ORDER — MIDAZOLAM HCL 2 MG/2ML IJ SOLN
INTRAMUSCULAR | Status: AC
  Filled 2017-12-01: qty 2

## 2017-12-01 MED ORDER — CHLORHEXIDINE GLUCONATE CLOTH 2 % EX PADS
6.0000 | MEDICATED_PAD | Freq: Once | CUTANEOUS | Status: DC
Start: 2017-12-01 — End: 2017-12-01

## 2017-12-01 MED ORDER — CEFAZOLIN SODIUM-DEXTROSE 2-4 GM/100ML-% IV SOLN
2.0000 g | INTRAVENOUS | Status: AC
  Administered 2017-12-01: 2 g via INTRAVENOUS

## 2017-12-01 MED ORDER — FENTANYL CITRATE (PF) 100 MCG/2ML IJ SOLN
50.0000 ug | INTRAMUSCULAR | Status: DC | PRN
  Administered 2017-12-01: 100 ug via INTRAVENOUS

## 2017-12-01 MED ORDER — PROMETHAZINE HCL 25 MG/ML IJ SOLN: 6.2500 mg | INTRAMUSCULAR | Status: DC | PRN

## 2017-12-01 MED ORDER — ONDANSETRON HCL 4 MG/2ML IJ SOLN
INTRAMUSCULAR | Status: DC | PRN
  Administered 2017-12-01: 4 mg via INTRAVENOUS

## 2017-12-01 MED ORDER — FENTANYL CITRATE (PF) 100 MCG/2ML IJ SOLN: 25.0000 ug | INTRAMUSCULAR | Status: DC | PRN

## 2017-12-01 MED ORDER — FENTANYL CITRATE (PF) 100 MCG/2ML IJ SOLN
INTRAMUSCULAR | Status: AC
  Filled 2017-12-01: qty 2

## 2017-12-01 MED ORDER — SUGAMMADEX SODIUM 500 MG/5ML IV SOLN
INTRAVENOUS | Status: AC
  Filled 2017-12-01: qty 5

## 2017-12-01 MED ORDER — SODIUM BICARBONATE 4 % IV SOLN
INTRAVENOUS | Status: DC | PRN
  Administered 2017-12-01: 2.5 mL via SUBCUTANEOUS

## 2017-12-01 MED ORDER — PROPOFOL 500 MG/50ML IV EMUL
INTRAVENOUS | Status: DC | PRN
  Administered 2017-12-01: 250 ug/kg/min via INTRAVENOUS

## 2017-12-01 MED ORDER — PROPOFOL 10 MG/ML IV BOLUS
INTRAVENOUS | Status: DC | PRN
  Administered 2017-12-01: 200 mg via INTRAVENOUS

## 2017-12-01 MED ORDER — CEFAZOLIN SODIUM-DEXTROSE 2-4 GM/100ML-% IV SOLN
INTRAVENOUS | Status: AC
  Filled 2017-12-01: qty 100

## 2017-12-01 SURGICAL SUPPLY — 81 items
APL SKNCLS STERI-STRIP NONHPOA (GAUZE/BANDAGES/DRESSINGS) ×2
BAG DECANTER FOR FLEXI CONT (MISCELLANEOUS) IMPLANT
BALL CTTN LRG ABS STRL LF (GAUZE/BANDAGES/DRESSINGS)
BENZOIN TINCTURE PRP APPL 2/3 (GAUZE/BANDAGES/DRESSINGS) ×1 IMPLANT
BLADE HEX COATED 2.75 (ELECTRODE) IMPLANT
BLADE KNIFE PERSONA 10 (BLADE) ×2 IMPLANT
BLADE KNIFE PERSONA 15 (BLADE) ×3 IMPLANT
BNDG CMPR 9X4 STRL LF SNTH (GAUZE/BANDAGES/DRESSINGS)
BNDG COHESIVE 4X5 TAN STRL (GAUZE/BANDAGES/DRESSINGS) IMPLANT
BNDG ESMARK 4X9 LF (GAUZE/BANDAGES/DRESSINGS) ×2 IMPLANT
BNDG GAUZE ELAST 4 BULKY (GAUZE/BANDAGES/DRESSINGS) IMPLANT
CANISTER SUCT 1200ML W/VALVE (MISCELLANEOUS) ×1 IMPLANT
CLEANER CAUTERY TIP 5X5 PAD (MISCELLANEOUS) IMPLANT
COTTONBALL LRG STERILE PKG (GAUZE/BANDAGES/DRESSINGS) IMPLANT
COVER BACK TABLE 60X90IN (DRAPES) ×3 IMPLANT
COVER MAYO STAND STRL (DRAPES) ×3 IMPLANT
DECANTER SPIKE VIAL GLASS SM (MISCELLANEOUS) IMPLANT
DRAPE U-SHAPE 76X120 STRL (DRAPES) ×3 IMPLANT
DRSG PAD ABDOMINAL 8X10 ST (GAUZE/BANDAGES/DRESSINGS) ×3 IMPLANT
ELECT NDL BLADE 2-5/6 (NEEDLE) IMPLANT
ELECT NEEDLE BLADE 2-5/6 (NEEDLE) IMPLANT
ELECT REM PT RETURN 9FT ADLT (ELECTROSURGICAL) ×3
ELECTRODE REM PT RTRN 9FT ADLT (ELECTROSURGICAL) ×2 IMPLANT
GAUZE SPONGE 4X4 12PLY STRL (GAUZE/BANDAGES/DRESSINGS) ×3 IMPLANT
GAUZE SPONGE 4X4 12PLY STRL LF (GAUZE/BANDAGES/DRESSINGS) IMPLANT
GAUZE XEROFORM 1X8 LF (GAUZE/BANDAGES/DRESSINGS) ×2 IMPLANT
GAUZE XEROFORM 5X9 LF (GAUZE/BANDAGES/DRESSINGS) IMPLANT
GLOVE BIO SURGEON STRL SZ 6.5 (GLOVE) ×3 IMPLANT
GLOVE BIOGEL M STRL SZ7.5 (GLOVE) ×3 IMPLANT
GLOVE BIOGEL PI IND STRL 8 (GLOVE) ×2 IMPLANT
GLOVE BIOGEL PI INDICATOR 8 (GLOVE) ×1
GLOVE ECLIPSE 7.0 STRL STRAW (GLOVE) ×3 IMPLANT
GOWN STRL REUS W/ TWL LRG LVL3 (GOWN DISPOSABLE) IMPLANT
GOWN STRL REUS W/ TWL XL LVL3 (GOWN DISPOSABLE) ×4 IMPLANT
GOWN STRL REUS W/TWL LRG LVL3 (GOWN DISPOSABLE)
GOWN STRL REUS W/TWL XL LVL3 (GOWN DISPOSABLE) ×6
IV LACTATED RINGERS 1000ML (IV SOLUTION) IMPLANT
IV NS 500ML (IV SOLUTION)
IV NS 500ML BAXH (IV SOLUTION) IMPLANT
LINER CANISTER 1000CC FLEX (MISCELLANEOUS) ×3 IMPLANT
MARKER SKIN DUAL TIP RULER LAB (MISCELLANEOUS) ×3 IMPLANT
NDL HYPO 25X1 1.5 SAFETY (NEEDLE) ×2 IMPLANT
NDL SAFETY ECLIPSE 18X1.5 (NEEDLE) IMPLANT
NDL SPNL 18GX3.5 QUINCKE PK (NEEDLE) ×2 IMPLANT
NEEDLE HYPO 18GX1.5 SHARP (NEEDLE)
NEEDLE HYPO 25X1 1.5 SAFETY (NEEDLE) ×3 IMPLANT
NEEDLE SPNL 18GX3.5 QUINCKE PK (NEEDLE) IMPLANT
PACK BASIN DAY SURGERY FS (CUSTOM PROCEDURE TRAY) ×3 IMPLANT
PAD CLEANER CAUTERY TIP 5X5 (MISCELLANEOUS)
PEN SKIN MARKING BROAD TIP (MISCELLANEOUS) ×2 IMPLANT
PENCIL BUTTON HOLSTER BLD 10FT (ELECTRODE) IMPLANT
SHEET MEDIUM DRAPE 40X70 STRL (DRAPES) ×2 IMPLANT
SHEETING SILICONE GEL EPI DERM (MISCELLANEOUS) IMPLANT
SLEEVE SCD COMPRESS KNEE MED (MISCELLANEOUS) ×3 IMPLANT
SPONGE LAP 18X18 X RAY DECT (DISPOSABLE) ×4 IMPLANT
STAPLER VISISTAT 35W (STAPLE) IMPLANT
STRIP CLOSURE SKIN 1/2X4 (GAUZE/BANDAGES/DRESSINGS) IMPLANT
STRIP CLOSURE SKIN 1/8X3 (GAUZE/BANDAGES/DRESSINGS) IMPLANT
STRIP SUTURE WOUND CLOSURE 1/2 (SUTURE) ×1 IMPLANT
SUCTION FRAZIER HANDLE 10FR (MISCELLANEOUS)
SUCTION TUBE FRAZIER 10FR DISP (MISCELLANEOUS) IMPLANT
SUT ETHILON 3 0 PS 1 (SUTURE) IMPLANT
SUT MNCRL AB 3-0 PS2 18 (SUTURE) ×1 IMPLANT
SUT MON AB 2-0 CT1 36 (SUTURE) IMPLANT
SUT MON AB 5-0 PS2 18 (SUTURE) IMPLANT
SUT PROLENE 4 0 P 3 18 (SUTURE) ×1 IMPLANT
SUT PROLENE 4 0 PS 2 18 (SUTURE) IMPLANT
SUT VIC AB 0 CT1 27 (SUTURE)
SUT VIC AB 0 CT1 27XBRD ANBCTR (SUTURE) IMPLANT
SYR 20CC LL (SYRINGE) IMPLANT
SYR 50ML LL SCALE MARK (SYRINGE) ×5 IMPLANT
SYR CONTROL 10ML LL (SYRINGE) ×3 IMPLANT
TAPE HYPAFIX 6X30 (GAUZE/BANDAGES/DRESSINGS) ×1 IMPLANT
TOWEL OR 17X24 6PK STRL BLUE (TOWEL DISPOSABLE) ×6 IMPLANT
TRAY DSU PREP LF (CUSTOM PROCEDURE TRAY) ×3 IMPLANT
TUBE CONNECTING 20X1/4 (TUBING) ×1 IMPLANT
TUBING INFILTRATION IT-10001 (TUBING) ×1 IMPLANT
TUBING SET GRADUATE ASPIR 12FT (MISCELLANEOUS) ×3 IMPLANT
UNDERPAD 30X30 (UNDERPADS AND DIAPERS) IMPLANT
VAC PENCILS W/TUBING CLEAR (MISCELLANEOUS) ×1 IMPLANT
YANKAUER SUCT BULB TIP NO VENT (SUCTIONS) ×3 IMPLANT

## 2017-12-01 NOTE — OR Nursing (Signed)
Prior to the procedure, the surgeon spoke with the patient and discussed the locations of the masses with her.  Because one mass was on the posterior neck and one was in the middle of her chest, he determined that at this time he would only excise the posterior neck mass.  Dr. Towanda Malkin discussed this with the patient.

## 2017-12-01 NOTE — Anesthesia Procedure Notes (Signed)
Procedure Name: Intubation Date/Time: 12/01/2017 12:50 PM Performed by: Terrance Mass, CRNA Pre-anesthesia Checklist: Patient identified, Emergency Drugs available, Suction available and Patient being monitored Patient Re-evaluated:Patient Re-evaluated prior to induction Oxygen Delivery Method: Circle system utilized Preoxygenation: Pre-oxygenation with 100% oxygen Induction Type: IV induction Ventilation: Mask ventilation without difficulty Laryngoscope Size: Miller and 2 Grade View: Grade I Tube type: Oral Tube size: 7.0 mm Number of attempts: 1 Airway Equipment and Method: Stylet Placement Confirmation: ETT inserted through vocal cords under direct vision,  positive ETCO2 and breath sounds checked- equal and bilateral Secured at: 21 cm Tube secured with: Tape Dental Injury: Teeth and Oropharynx as per pre-operative assessment

## 2017-12-01 NOTE — Transfer of Care (Signed)
Immediate Anesthesia Transfer of Care Note  Patient: Stefanie Baldwin  Procedure(s) Performed: EXCISION MASS POSTERIOR NECK (N/A Neck) LIPOSUCTION (N/A )  Patient Location: PACU  Anesthesia Type:General  Level of Consciousness: awake, alert  and oriented  Airway & Oxygen Therapy: Patient Spontanous Breathing and Patient connected to face mask  Post-op Assessment: Report given to RN and Post -op Vital signs reviewed and stable  Post vital signs: Reviewed and stable  Last Vitals:  Vitals:   12/01/17 1100  BP: 125/66  Pulse: 85  Resp: 18  Temp: 36.8 C  SpO2: 100%    Last Pain:  Vitals:   12/01/17 1100  TempSrc: Oral         Complications: No apparent anesthesia complications

## 2017-12-01 NOTE — Anesthesia Procedure Notes (Signed)
Performed by: Sydell Prowell W, CRNA       

## 2017-12-01 NOTE — Discharge Instructions (Signed)
Activity (include date of return to work if known) As tolerated: NO showers NO driving No heavy activities  Diet:regular No restrictions:  Wound Care: Keep dressing clean & dry Do not change dressings  Special Instructions: Do not raise arms up Call Doctor if any unusual problems occur such as pain, excessive Bleeding, unrelieved Nausea/vomiting, Fever &/or chills  When lying down, keep head elevated on 2-3 pillows or back-rest  Follow-up appointment: Please call the office.  The patient received discharge instruction from:___________________________________________   Patient signature ________________________________________ / Date___________    Signature of individual providing instructions/ Date________________                 Post Anesthesia Home Care Instructions  Activity: Get plenty of rest for the remainder of the day. A responsible individual must stay with you for 24 hours following the procedure.  For the next 24 hours, DO NOT: -Drive a car -Paediatric nurse -Drink alcoholic beverages -Take any medication unless instructed by your physician -Make any legal decisions or sign important papers.  Meals: Start with liquid foods such as gelatin or soup. Progress to regular foods as tolerated. Avoid greasy, spicy, heavy foods. If nausea and/or vomiting occur, drink only clear liquids until the nausea and/or vomiting subsides. Call your physician if vomiting continues.  Special Instructions/Symptoms: Your throat may feel dry or sore from the anesthesia or the breathing tube placed in your throat during surgery. If this causes discomfort, gargle with warm salt water. The discomfort should disappear within 24 hours.  If you had a scopolamine patch placed behind your ear for the management of post- operative nausea and/or vomiting:  1. The medication in the patch is effective for 72 hours, after which it should be removed.  Wrap patch in a tissue and discard in  the trash. Wash hands thoroughly with soap and water. 2. You may remove the patch earlier than 72 hours if you experience unpleasant side effects which may include dry mouth, dizziness or visual disturbances. 3. Avoid touching the patch. Wash your hands with soap and water after contact with the patch.

## 2017-12-01 NOTE — Anesthesia Preprocedure Evaluation (Addendum)
Anesthesia Evaluation  Patient identified by MRN, date of birth, ID band Patient awake    Reviewed: Allergy & Precautions, NPO status , Patient's Chart, lab work & pertinent test results  History of Anesthesia Complications (+) PONV  Airway Mallampati: II  TM Distance: >3 FB Neck ROM: Full    Dental  (+) Dental Advisory Given   Pulmonary neg pulmonary ROS,    Pulmonary exam normal breath sounds clear to auscultation       Cardiovascular negative cardio ROS Normal cardiovascular exam Rhythm:Regular Rate:Normal     Neuro/Psych negative neurological ROS  negative psych ROS   GI/Hepatic Neg liver ROS, GERD  Controlled and Medicated,  Endo/Other  Morbid obesity  Renal/GU negative Renal ROS  negative genitourinary   Musculoskeletal negative musculoskeletal ROS (+)   Abdominal   Peds  Hematology negative hematology ROS (+) REFUSES BLOOD PRODUCTS,   Anesthesia Other Findings   Reproductive/Obstetrics Hx ectopic pregnancy                             Anesthesia Physical Anesthesia Plan  ASA: II  Anesthesia Plan: General   Post-op Pain Management:    Induction: Intravenous  PONV Risk Score and Plan: 4 or greater and Treatment may vary due to age or medical condition, TIVA, Ondansetron, Midazolam, Dexamethasone and Scopolamine patch - Pre-op  Airway Management Planned: Oral ETT  Additional Equipment: None  Intra-op Plan:   Post-operative Plan: Extubation in OR  Informed Consent: I have reviewed the patients History and Physical, chart, labs and discussed the procedure including the risks, benefits and alternatives for the proposed anesthesia with the patient or authorized representative who has indicated his/her understanding and acceptance.   Dental advisory given  Plan Discussed with: CRNA  Anesthesia Plan Comments:        Anesthesia Quick Evaluation

## 2017-12-01 NOTE — Anesthesia Postprocedure Evaluation (Signed)
Anesthesia Post Note  Patient: [name]  Procedure(s) Performed: EXCISION MASS POSTERIOR NECK (N/A Neck) LIPOSUCTION (N/A )     Patient location during evaluation: PACU Anesthesia Type: General Level of consciousness: awake and alert Pain management: pain level controlled Vital Signs Assessment: post-procedure vital signs reviewed and stable Respiratory status: spontaneous breathing, nonlabored ventilation and respiratory function stable Cardiovascular status: blood pressure returned to baseline and stable Postop Assessment: no apparent nausea or vomiting Anesthetic complications: no    Last Vitals:  Vitals:   12/01/17 1345 12/01/17 1400  BP: 118/80 121/82  Pulse: (!) 106 95  Resp: 20 (!) 22  Temp:    SpO2: 100% 100%    Last Pain:  Vitals:   12/01/17 1400  TempSrc:   PainSc: 0-No pain                 Audry Pili

## 2017-12-02 ENCOUNTER — Encounter (HOSPITAL_BASED_OUTPATIENT_CLINIC_OR_DEPARTMENT_OTHER): Payer: Self-pay | Admitting: Specialist

## 2017-12-02 NOTE — Addendum Note (Signed)
Addendum  created 12/02/17 1043 by Tawni Millers, CRNA   Charge Capture section accepted

## 2017-12-02 NOTE — Op Note (Signed)
NAME:  Stefanie Baldwin, Stefanie Baldwin                     ACCOUNT NO.:  MEDICAL RECORD NO.:  [id number]  LOCATION:                                 FACILITY:  PHYSICIAN:  Chord Takahashi L. Towanda Malkin, M.D.   DATE OF BIRTH:  DATE OF PROCEDURE:  12/01/2017 DATE OF DISCHARGE:                              OPERATIVE REPORT   INDICATION:  A 46 year old lady with enlarging mass involving her posterior neck area, hibernoma, measures approximately 12 inches by 10 inches, increased growth and discomfort causing headaches and muscle stiffness of the neck.  PROCEDURE DONE:  Excision using liposuction assistance.  ANESTHESIA:  General.  DESCRIPTION OF PROCEDURE:  The patient underwent general anesthesia and intubated orally and then rotated to the prone position.  Prep was done to the neck and back areas using Hibiclens soap and solution and walled off with sterile towels and drapes so as to make a sterile field. Tumescent was injected with mass, total of approximately 300 mL allowed to sit.  After allowing to sit, then liposuction assistance was used to liposuction the whole area starting at 3 o'clock, 6 o'clock, 9 o'clock, and 12 o'clock positions, removed over 200 mL of lipomatous material. All the materials were removed using sharp dissection and Metzenbaum scissors.  After this, after being happy with the contour smoothness, which went from the nap of the neck up to the posterior neck hairline, we then able to close the wounds in layers with 3-0 Monocryl subcutaneously and multiple sutures of 4-0 Prolene.  The wounds were covered after flap closure.  Steri-Strip sterile dressing applied to all areas.  She has tolerated the procedure very well and was taken to the recovery in excellent condition.  ESTIMATED BLOOD LOSS:  Less than 10 mL.  COMPLICATIONS:  None.     Odella Aquas. Towanda Malkin, M.D.     Elie Confer  D:  12/01/2017  T:  12/02/2017  Job:  [id number]

## 2017-12-17 ENCOUNTER — Telehealth: Payer: Self-pay

## 2017-12-17 NOTE — Telephone Encounter (Signed)
Pt called stating that her 6 month handicap parking pass is expiring. Pt requests a new handicap parking pass and would like to know if it can be for longer than 6 months. Please Advise.

## 2017-12-19 NOTE — Telephone Encounter (Signed)
Per provider pt needs to be seen in office. Pt notified and will call back to schedule.

## 2018-03-09 ENCOUNTER — Other Ambulatory Visit: Payer: Self-pay | Admitting: Obstetrics

## 2018-03-20 ENCOUNTER — Other Ambulatory Visit: Payer: Self-pay | Admitting: Obstetrics & Gynecology

## 2018-03-20 DIAGNOSIS — Z139 Encounter for screening, unspecified: Secondary | ICD-10-CM

## 2018-03-24 ENCOUNTER — Ambulatory Visit
Admission: RE | Admit: 2018-03-24 | Discharge: 2018-03-24 | Disposition: A | Discharge status: Home / Self Care | Payer: 59 | Source: Ambulatory Visit | Attending: Obstetrics & Gynecology | Admitting: Obstetrics & Gynecology

## 2018-03-24 DIAGNOSIS — Z139 Encounter for screening, unspecified: Secondary | ICD-10-CM

## 2018-03-26 ENCOUNTER — Ambulatory Visit: Payer: 59 | Admitting: Podiatry

## 2018-03-26 DIAGNOSIS — R262 Difficulty in walking, not elsewhere classified: Secondary | ICD-10-CM

## 2018-03-26 DIAGNOSIS — M21969 Unspecified acquired deformity of unspecified lower leg: Secondary | ICD-10-CM

## 2018-03-26 DIAGNOSIS — M722 Plantar fascial fibromatosis: Secondary | ICD-10-CM

## 2018-03-26 NOTE — Patient Instructions (Signed)
Seen for painful feet. All keratotic lesions debrided. Ankle brace may benefit for the left ankle pain. Need to re-evaluate orthotics. Bring orthotics on next visit. Return in 3 months.

## 2018-03-26 NOTE — Progress Notes (Signed)
Subjective: 47 y.o. year old female patient presents complaining of pain in both feet. Pain in left ankle for the past few months.. Heel spur and foot pain has been for years. Still having plantar fasciitis L>R.  She was seen in August 2018 and treated with Night Splint and CAM walker for Plantar fasciitis. When she returns to Zumba exercise, she start having pain.  Objective: Dermatologic: Painful plantar callus under 5th MPJ bilateral. Vascular: Pedal pulses are all palpable. Orthopedic: Short first with elevated status first metatarsal bilateral. Forefoot varus bilateral. Neurologic: All epicritic and tactile sensations grossly intact.  Assessment: Painful callus sub 5 bilateral. Elevated and hypermobile first ray bilateral. STJ hyperpronation bilateral.  Treatment: All plantar calluses debrided.  Reviewed the benefit of Ankle brace to reduce excess pronation at ankle joint on left. May need a new pair orthotics. Patient will bring back her old orthotics. Patient will wait on Ankle brace till the insurance benefit is confirmed. Return or as needed.

## 2018-03-28 ENCOUNTER — Encounter: Payer: Self-pay | Admitting: Podiatry

## 2018-03-30 ENCOUNTER — Other Ambulatory Visit: Payer: Self-pay | Admitting: Obstetrics & Gynecology

## 2018-03-30 ENCOUNTER — Telehealth: Payer: Self-pay | Admitting: *Deleted

## 2018-03-30 NOTE — Telephone Encounter (Signed)
Patient called requesting refill on birth control pills, has annual exam scheduled on 07/16/18. [facility] chart has not arrived, I told pt I will need chart so refill can be sent to pharmacy. Pt will call [facility] to have chart sent.

## 2018-03-30 NOTE — Telephone Encounter (Signed)
Dr.Lavoie [facility] chart has arrived, pt needs refill on pills. I didn't see in the paper chart where Rx was prescribed (I would just refilled if I saw this) I called the pharmacy to confirm you prescribed Rx and was informed you have. Pt has annual scheduled in august.

## 2018-03-31 NOTE — Telephone Encounter (Signed)
Pt chart arrived Rx sent see refill request.

## 2018-06-25 ENCOUNTER — Ambulatory Visit: Payer: 59 | Admitting: Podiatry

## 2018-06-25 DIAGNOSIS — M79671 Pain in right foot: Secondary | ICD-10-CM | POA: Diagnosis not present

## 2018-06-25 DIAGNOSIS — M21969 Unspecified acquired deformity of unspecified lower leg: Secondary | ICD-10-CM

## 2018-06-25 DIAGNOSIS — M79672 Pain in left foot: Secondary | ICD-10-CM | POA: Diagnosis not present

## 2018-06-25 DIAGNOSIS — Q828 Other specified congenital malformations of skin: Secondary | ICD-10-CM | POA: Diagnosis not present

## 2018-06-25 NOTE — Progress Notes (Signed)
Ankles doing better. Now just need trimming the callus.  Subjective: 47 y.o. year old female patient presents requesting calluses trimmed.  She was seen for plantar fasciitis and ankle pain on left  On 03/26/18. Stated that her ankle pain has subsided. Now just want to have her calluses trimmed.  HPI: She was seen in August 2018 and treated with Night Splint and CAM walker for Plantar fasciitis. When she returns to [employer] exercise, she start having pain.  Objective: Dermatologic: Thick plantar calluses under the 5th MPJ bilateral, painful.  Severe heel calluses under plantar medial bilateral. Vascular: All pedal pulses are palpable. 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 Forefoot varus on both feet.  Neurologic: All epicritic and tactile sensations grossly intact.  Assessment: Plantar keratosis sub 5 bilateral symptomatic.  Hyperkeratosis plantar heel bilateral. Hypermobile and elevated first ray with forefoot varus bilateral.  Excess STJ pronation bilateral.  Treatment: All plantar lesions debrided. Pain relieved. Return as needed.

## 2018-06-25 NOTE — Patient Instructions (Signed)
Seen for painful calluses. All lesions under 5th MPJ and both heels debrided. Return in 2 months or sooner if needed.

## 2018-06-29 ENCOUNTER — Encounter: Payer: Self-pay | Admitting: Podiatry

## 2018-07-06 ENCOUNTER — Telehealth: Payer: Self-pay | Admitting: *Deleted

## 2018-07-06 DIAGNOSIS — R102 Pelvic and perineal pain: Secondary | ICD-10-CM

## 2018-07-06 NOTE — Telephone Encounter (Signed)
Patient was told to follow up for ultrasound in 05/2017 visit, never did, has annual exam scheduled 07/16/18. She would like to have ultrasound prior to annual. No ultrasound appointments available here, she asked if ultrasound can be done at another location? Or do you prefer for patient to have annual with you first? Please advise

## 2018-07-08 NOTE — Telephone Encounter (Signed)
Patient preferred to have ultrasound done at [place], order placed she will call to schedule.

## 2018-07-08 NOTE — Telephone Encounter (Signed)
Can schedule Pelvic US at [facility] and will discuss results at [facility] visit 07/16/2018.  If she is ok to see me first on 07/16/2018 and schedule a pelvic US at first available time here, it would be fine too.

## 2018-07-16 ENCOUNTER — Encounter: Payer: 59 | Admitting: Obstetrics & Gynecology

## 2018-07-16 ENCOUNTER — Other Ambulatory Visit: Payer: 59

## 2018-07-20 NOTE — Telephone Encounter (Signed)
Patient scheduled now on 10/17/18 here with  [facility]

## 2018-08-11 ENCOUNTER — Ambulatory Visit: Payer: 59 | Admitting: Obstetrics & Gynecology

## 2018-08-11 ENCOUNTER — Encounter: Payer: Self-pay | Admitting: Obstetrics & Gynecology

## 2018-08-11 DIAGNOSIS — D252 Subserosal leiomyoma of uterus: Secondary | ICD-10-CM

## 2018-08-11 DIAGNOSIS — R350 Frequency of micturition: Secondary | ICD-10-CM | POA: Diagnosis not present

## 2018-08-11 DIAGNOSIS — G8929 Other chronic pain: Secondary | ICD-10-CM | POA: Diagnosis not present

## 2018-08-11 DIAGNOSIS — D251 Intramural leiomyoma of uterus: Secondary | ICD-10-CM | POA: Diagnosis not present

## 2018-08-11 DIAGNOSIS — R102 Pelvic and perineal pain: Secondary | ICD-10-CM

## 2018-08-11 NOTE — Progress Notes (Signed)
Stefanie Baldwin [birth year] [id number]        47 y.o.  G2P1A1L1 Married.  Vasectomy  RP: Chronic pelvic pain for many years  HPI: Patient had oligomenorrhea in the spring of 2018.  May 23, 2017 TSH normal, prolactin normal, FSH normal/premenopausal at 9.4.  Patient's menstrual period then resumed a normal monthly pattern with normal flow and no metrorrhagia.  Patient has suffered from chronic midline pelvic pain for at least 5 years. She takes ibuprofen frequently.  Her pain is a crampy/pressure pain present most days.  The pain is not much different whether she is on her menstrual period or not.  Patient consulted at [facility] and was started on Enpress 28 in 03/2018.  The pill did not change her pain or menstrual periods.  A pelvic ultrasound was done July 30, 2018 showing multiple fibroids, 3 of which were measured at 1.8 cm to 2.9 cm, the endometrium appeared thin at 4 mm, the right ovary was normal and the left ovary was not visualized.  Patient was also evaluated by a urologist for recurrent symptoms of UTI, both her urine culture was negative.  Remote history per patient of interstitial cystitis, but this diagnosis was not entertained at the time of her recent urology evaluation.  No recent cystoscopy done.  Bowel movements normal.  No fever.  Patient has a history of a ruptured ectopic pregnancy and laparoscopies for ovarian cystectomies.                                                                                   OB History  Gravida Para Term Preterm AB Living  2 1     1     SAB TAB Ectopic Multiple Live Births      1        # Outcome Date GA Lbr Len/2nd Weight Sex Delivery Anes PTL Lv  2 Para           1 Ectopic             Past medical history,surgical history, problem list, medications, allergies, family history and social history were all reviewed and documented in the EPIC chart.   Directed ROS with pertinent positives and negatives documented in the history of present  illness/assessment and plan.  Exam:  There were no vitals filed for this visit. General appearance:  Normal  Abdomen: Normal  Gynecologic exam: Vulva normal.  Bimanual exam: Uterus anteverted, mildly increased in volume to about 10 cm with nodularities, mildly tender.  No adnexal mass felt, mildly tender bilaterally.  Pelvic US at [facility] 07/30/2018: Uterus: Size = 10.4 x 5.4 x 6.5 cm. Multiple fibroids identified. Index lesions include an anterior intramural lesion measuring up to 2.9 cm, posterior wall measuring up to 2.6 cm, and posterior wall inferiorly with probable subserosal component measuring up to 1.8 cm. Endometrium is not clearly seen due to shadowing, but estimated 4 mm in width. No significant distortion of the endometrial canal aside from the nonvisualized fundal aspect. .Right ovary/adnexa: Size = 3.3 x 3.1 x 2.8 cm, estimated volume 15 cc. Simple appearing functional cyst measuring up to 3.0 cm. .Left ovary/adnexa: Not visualized. Marland Kitchen  Peritoneum: No fluid in the cul-de-sac. .Right ovary: Arterial and venous blood flow are documented. .Left ovary: Not assessed.  U/A: Dark yellow clear, protein 1+, nitrites negative, white blood cells 0-5, red blood cells 3-10, few bacteria.  Pending urine culture.   Assessment/Plan:  47 y.o. G2P0010   1. Chronic pelvic pain in female Chronic pelvic pain for many years associated with uterine fibroids.  Possible pelvic endometriosis.  History of ruptured ectopic pregnancy.  History of laparoscopic ovarian cystectomies.  No improvement on birth control pills.  Patient declines all other hormonal therapies.  Depo-Lupron discussed with patient, but declined.  No desire to preserve fertility at age 89 and partner's vasectomy.  Decision to proceed with an XI robotic total laparoscopic hysterectomy with bilateral salpingectomy.  Possible need for lysis of adhesions.  Preop, surgery, risks and benefits as well as recovery reviewed with  patient.  Will organize the surgery and patient will follow-up for preop visit.  2. Intramural and subserous leiomyoma of uterus Intramural and subserosal uterine myoma with an overall uterine size at 10.4 x 5.4 x 6.5 cm.  3. Frequency of urination Probably no acute cystitis, but urine culture pending.  Red blood cells 3-10.  Recommend reevaluation by urology to rule out interstitial cystitis, as it could be part of her chronic pain diagnosis. - Urinalysis,Complete w/RFL Culture  Counseling on above issues and coordination of care more than 50% for 25 minutes  [name] MD, 3:02 PM 08/11/2018

## 2018-08-11 NOTE — Patient Instructions (Signed)
1. Chronic pelvic pain in female Chronic pelvic pain for many years associated with uterine fibroids.  Possible pelvic endometriosis.  History of ruptured ectopic pregnancy.  History of laparoscopic ovarian cystectomies.  No improvement on birth control pills.  Patient declines all other hormonal therapies.  Depo-Lupron discussed with patient, but declined.  No desire to preserve fertility at age 47 and partner's vasectomy.  Decision to proceed with an XI robotic total laparoscopic hysterectomy with bilateral salpingectomy.  Possible need for lysis of adhesions.  Preop, surgery, risks and benefits as well as recovery reviewed with patient.  Will organize the surgery and patient will follow-up for preop visit.  2. Intramural and subserous leiomyoma of uterus Intramural and subserosal uterine myoma with an overall uterine size at 10.4 x 5.4 x 6.5 cm.  3. Frequency of urination Probably no acute cystitis, but urine culture pending.  Red blood cells 3-10.  Recommend reevaluation by urology to rule out interstitial cystitis, as it could be part of her chronic pain diagnosis. - Urinalysis,Complete w/RFL Culture  Stefanie Baldwin, it was a pleasure seeing you today!  Total Laparoscopic Hysterectomy A total laparoscopic hysterectomy is a minimally invasive surgery to remove your uterus and cervix. This surgery is performed by making several small cuts (incisions) in your abdomen. It can also be done with a thin, lighted tube (laparoscope) inserted into two small incisions in your lower abdomen. Your fallopian tubes and ovaries can be removed (bilateral salpingo-oophorectomy) during this surgery as well.Benefits of minimally invasive surgery include:  Less pain.  Less risk of blood loss.  Less risk of infection.  Quicker return to normal activities.  Tell a health care provider about:  Any allergies you have.  All medicines you are taking, including vitamins, herbs, eye drops, creams, and over-the-counter  medicines.  Any problems you or family members have had with anesthetic medicines.  Any blood disorders you have.  Any surgeries you have had.  Any medical conditions you have. What are the risks? Generally, this is a safe procedure. However, as with any procedure, complications can occur. Possible complications include:  Bleeding.  Blood clots in the legs or lung.  Infection.  Injury to surrounding organs.  Problems with anesthesia.  Early menopause symptoms (hot flashes, night sweats, insomnia).  Risk of conversion to an open abdominal incision.  What happens before the procedure?  Ask your health care provider about changing or stopping your regular medicines.  Do not take aspirin or blood thinners (anticoagulants) for 1 week before the surgery or as told by your health care provider.  Do not eat or drink anything for 8 hours before the surgery or as told by your health care provider.  Quit smoking if you smoke.  Arrange for a ride home after surgery and for someone to help you at home during recovery. What happens during the procedure?  You will be given antibiotic medicine.  An IV tube will be placed in your arm. You will be given medicine to make you sleep (general anesthetic).  A gas (carbon dioxide) will be used to inflate your abdomen. This will allow your surgeon to look inside your abdomen, perform your surgery, and treat any other problems found if necessary.  Three or four small incisions (often less than 1/2 inch) will be made in your abdomen. One of these incisions will be made in the area of your belly button (navel). The laparoscope will be inserted into the incision. Your surgeon will look through the laparoscope while doing your  procedure.  Other surgical instruments will be inserted through the other incisions.  Your uterus may be removed through your vagina or cut into small pieces and removed through the small incisions.  Your incisions will be  closed. What happens after the procedure?  The gas will be released from inside your abdomen.  You will be taken to the recovery area where a nurse will watch and check your progress. Once you are awake, stable, and taking fluids well, without other problems, you will return to your room or be allowed to go home.  There is usually minimal discomfort following the surgery because the incisions are so small.  You will be given pain medicine while you are in the hospital and for when you go home. This information is not intended to replace advice given to you by your health care provider. Make sure you discuss any questions you have with your health care provider. Document Released: 09/29/2007 Document Revised: 05/09/2016 Document Reviewed: 06/22/2013 Elsevier Interactive Patient Education  2017 [employer].

## 2018-08-12 ENCOUNTER — Telehealth: Payer: Self-pay

## 2018-08-12 NOTE — Telephone Encounter (Signed)
I called patient to discuss scheduling surgery. Dicussed her ins benefits and her estimated surgery prepymt amount due by one week prior to surgery. We discussed dates and she wants to consider and call me by 08/18/18 to let me know her decision.

## 2018-08-13 LAB — URINALYSIS, COMPLETE W/RFL CULTURE
Bilirubin Urine: NEGATIVE
GLUCOSE, UA: NEGATIVE
Hyaline Cast: NONE SEEN /LPF
KETONES UR: NEGATIVE
LEUKOCYTE ESTERASE: NEGATIVE
NITRITES URINE, INITIAL: NEGATIVE
PH: 5.5 (ref 5.0–8.0)
Specific Gravity, Urine: 1.025 (ref 1.001–1.03)

## 2018-08-13 LAB — URINE CULTURE
MICRO NUMBER:: 91029298
SPECIMEN QUALITY:: ADEQUATE

## 2018-08-13 LAB — CULTURE INDICATED

## 2018-08-17 ENCOUNTER — Telehealth (HOSPITAL_COMMUNITY): Payer: Self-pay | Admitting: Emergency Medicine

## 2018-08-17 ENCOUNTER — Encounter (HOSPITAL_COMMUNITY): Payer: Self-pay

## 2018-08-17 ENCOUNTER — Ambulatory Visit (HOSPITAL_COMMUNITY)
Admission: EM | Admit: 2018-08-17 | Discharge: 2018-08-17 | Disposition: A | Discharge status: Home / Self Care | Payer: 59 | Attending: Family Medicine | Admitting: Family Medicine

## 2018-08-17 DIAGNOSIS — R350 Frequency of micturition: Secondary | ICD-10-CM | POA: Diagnosis present

## 2018-08-17 DIAGNOSIS — N3091 Cystitis, unspecified with hematuria: Secondary | ICD-10-CM | POA: Diagnosis not present

## 2018-08-17 DIAGNOSIS — N3090 Cystitis, unspecified without hematuria: Secondary | ICD-10-CM | POA: Diagnosis not present

## 2018-08-17 DIAGNOSIS — J069 Acute upper respiratory infection, unspecified: Secondary | ICD-10-CM | POA: Insufficient documentation

## 2018-08-17 DIAGNOSIS — R300 Dysuria: Secondary | ICD-10-CM | POA: Diagnosis present

## 2018-08-17 LAB — POCT URINALYSIS DIP (DEVICE)
Bilirubin Urine: NEGATIVE
Glucose, UA: NEGATIVE mg/dL
KETONES UR: NEGATIVE mg/dL
NITRITE: NEGATIVE
PH: 6 (ref 5.0–8.0)
Protein, ur: 100 mg/dL — AB
Specific Gravity, Urine: 1.02 (ref 1.005–1.030)
Urobilinogen, UA: 1 mg/dL (ref 0.0–1.0)

## 2018-08-17 MED ORDER — SULFAMETHOXAZOLE-TRIMETHOPRIM 800-160 MG PO TABS: 1.0000 | ORAL_TABLET | Freq: Two times a day (BID) | ORAL | 0 refills | Status: DC

## 2018-08-17 MED ORDER — SULFAMETHOXAZOLE-TRIMETHOPRIM 800-160 MG PO TABS: 1.0000 | ORAL_TABLET | Freq: Two times a day (BID) | ORAL | 0 refills | Status: AC

## 2018-08-17 NOTE — ED Triage Notes (Signed)
Pt presents with right ear pain and urinary tract symptoms; frequency to urinate and blood in urine.

## 2018-08-17 NOTE — ED Provider Notes (Signed)
[facility]    ASSESSMENT & PLAN:  1. Cystitis   2. Viral upper respiratory tract infection     Meds ordered this encounter  Medications  . sulfamethoxazole-trimethoprim (BACTRIM DS,SEPTRA DS) 800-160 MG tablet    Sig: Take 1 tablet by mouth 2 (two) times daily for 5 days.    Dispense:  10 tablet    Refill:  0   Urine culture sent. Will notify patient when results available. Will follow up with her PCP or here if not showing improvement over the next 48 hours, sooner if needed. Continue Zyrtec for nasal congestion; allergic rhinitis symptoms vs URI.  Outlined signs and symptoms indicating need for more acute intervention. Patient verbalized understanding. After Visit Summary given.  SUBJECTIVE:  Stefanie Baldwin is a 47 y.o. female who complains of urinary frequency, urgency and dysuria for the past 2 days. No flank pain, fever, chills, abnormal vaginal discharge or bleeding. Hematuria: present. Normal PO intake. No abdominal pain. No self treatment. Ambulatory without difficulty. Recently returned from beach. Approx 2-3 days of nasal congestion and R ear discomfort. No ear drainage. Hearing normally. No OTC treatment. Takes Zyrtec daily for allergies.  LMP: Patient's last menstrual period was 07/27/2018.  ROS: As in HPI.  OBJECTIVE:  Vitals:   08/17/18 0841  BP: (!) 145/94  Pulse: 91  Resp: 20  Temp: 98.6 F (37 C)  TempSrc: Oral  SpO2: 97%   Appears well, in no apparent distress. Abdomen is soft without tenderness, guarding, mass, rebound or organomegaly. No CVA tenderness or inguinal adenopathy noted.  Labs Reviewed  POCT URINALYSIS DIP (DEVICE) - Abnormal; Notable for the following components:      Result Value   Hgb urine dipstick LARGE (*)    Protein, ur 100 (*)    Leukocytes, UA TRACE (*)    All other components within normal limits  URINE CULTURE    No Known Allergies  Past Medical History:  Diagnosis Date  . Complication of anesthesia     . Ectopic pregnancy   . GERD (gastroesophageal reflux disease)    uses OTC meds prn  . Mass    on neck and chest  . Obesity   . Pneumonia   . PONV (postoperative nausea and vomiting)    "severe" PNOV   Social History   Socioeconomic History  . Marital status: Married    Spouse name: Not on file  . Number of children: 0  . Years of education: Not on file  . Highest education level: Not on file  Occupational History  . Occupation: Buyer, retail  Social Needs  . Financial resource strain: Not on file  . Food insecurity:    Worry: Not on file    Inability: Not on file  . Transportation needs:    Medical: Not on file    Non-medical: Not on file  Tobacco Use  . Smoking status: Never Smoker  . Smokeless tobacco: Never Used  Substance and Sexual Activity  . Alcohol use: No    Alcohol/week: 0.0 standard drinks  . Drug use: No  . Sexual activity: Yes    Partners: Male    Comment: patient's husband with vasectomy  Lifestyle  . Physical activity:    Days per week: Not on file    Minutes per session: Not on file  . Stress: Not on file  Relationships  . Social connections:    Talks on phone: Not on file    Gets together: Not on file  Attends religious service: Not on file    Active member of club or organization: Not on file    Attends meetings of clubs or organizations: Not on file    Relationship status: Not on file  . Intimate partner violence:    Fear of current or ex partner: Not on file    Emotionally abused: Not on file    Physically abused: Not on file    Forced sexual activity: Not on file  Other Topics Concern  . Not on file  Social History Narrative  . Not on file   Family History  Problem Relation Age of Onset  . Thyroid disease Mother   . Diabetes Mother   . Hypertension Mother   . Heart disease Mother   . Diabetes Father   . Thyroid disease Brother   . Colon cancer Neg Hx   . Stomach cancer Neg Hx   . Rectal cancer Neg Hx   . Esophageal  cancer Neg Hx   . Liver cancer Neg Hx   . Breast cancer Neg Hx        Vanessa Kick, MD 08/17/18 [phone]

## 2018-08-19 ENCOUNTER — Telehealth: Payer: Self-pay | Admitting: *Deleted

## 2018-08-19 ENCOUNTER — Telehealth: Payer: Self-pay

## 2018-08-19 LAB — URINE CULTURE: Culture: 60000 — AB

## 2018-08-19 NOTE — Telephone Encounter (Signed)
Patient called back and asked to change the date to 10/06/18. Date changed.

## 2018-08-19 NOTE — Telephone Encounter (Signed)
She is already on BCPs.  We could switch to a different kind, like TriSprintec.  But no guarantee that it will be better.

## 2018-08-19 NOTE — Telephone Encounter (Signed)
Agree with 6 weeks off work postop.

## 2018-08-19 NOTE — Telephone Encounter (Signed)
Patient called stating when she had OV on 08/11/18 she had only spotting, now normal period flow wearing tampons changing every 3 hours, so not heavy, but states "I am tired of bleeding" spotting since 07/27/18 until yesterday when flow started.  Will be speaking with Juliann Pulse about surgery dates. In the meantime she asked if any medication she can take to stop the bleeding? States she does not want a "shot" please advise

## 2018-08-19 NOTE — Telephone Encounter (Addendum)
Patient called me back today as she decided she would like surgery scheduled for 10/06/18. We had previously discussed her ins benefits and estimated surgery prepymt due by one week prior to surgery and I will be mailing financial letter to her retiterating this info.  Surgery scheduled.  Patient asked about how long Dr. Marguerita Merles will want her to be out of work. She works a sedentary job. I told her we would start with 4 weeks and if needed more Dr. Marguerita Merles could extend time with documentation of problem. She asked why she could not have 8 weeks out of work and I explained that this surgery does not require that long of a recovery and disability companies know average times for recovery and expect documentation to support longer times out of work.  She asked if she could start with 6 weeks out of work?

## 2018-08-20 ENCOUNTER — Telehealth (HOSPITAL_COMMUNITY): Payer: Self-pay

## 2018-08-20 MED ORDER — FLUCONAZOLE 150 MG PO TABS: 150.0000 mg | ORAL_TABLET | Freq: Every day | ORAL | 0 refills | Status: AC

## 2018-08-20 NOTE — Telephone Encounter (Signed)
Patient informed with the below note, will keep taking current BCP.

## 2018-08-20 NOTE — Telephone Encounter (Signed)
Urine culture positive for E.coli. This was treated with Bactrim at [facility] visit.  Pt called and made aware.

## 2018-08-20 NOTE — Telephone Encounter (Signed)
Pt requesting diflucan for yeast infection from antibiotic.

## 2018-08-21 NOTE — Telephone Encounter (Signed)
Spoke with patient and informed her. °

## 2018-09-02 ENCOUNTER — Encounter: Payer: Self-pay | Admitting: Anesthesiology

## 2018-09-11 ENCOUNTER — Other Ambulatory Visit: Payer: Self-pay | Admitting: Obstetrics & Gynecology

## 2018-09-15 ENCOUNTER — Other Ambulatory Visit: Payer: Self-pay

## 2018-09-15 MED ORDER — LEVONORG-ETH ESTRAD TRIPHASIC PO TABS: 1.0000 | ORAL_TABLET | Freq: Every day | ORAL | 0 refills | Status: DC

## 2018-09-17 ENCOUNTER — Ambulatory Visit: Payer: 59 | Admitting: Obstetrics & Gynecology

## 2018-09-17 ENCOUNTER — Encounter: Payer: Self-pay | Admitting: Obstetrics & Gynecology

## 2018-09-17 VITALS — BP 140/88

## 2018-09-17 DIAGNOSIS — D251 Intramural leiomyoma of uterus: Secondary | ICD-10-CM

## 2018-09-17 DIAGNOSIS — R102 Pelvic and perineal pain: Secondary | ICD-10-CM

## 2018-09-17 DIAGNOSIS — D252 Subserosal leiomyoma of uterus: Secondary | ICD-10-CM | POA: Diagnosis not present

## 2018-09-17 DIAGNOSIS — G8929 Other chronic pain: Secondary | ICD-10-CM | POA: Diagnosis not present

## 2018-09-17 DIAGNOSIS — Z0289 Encounter for other administrative examinations: Secondary | ICD-10-CM

## 2018-09-17 NOTE — Progress Notes (Signed)
    Stefanie Baldwin 1971/11/05 [id number]        47 y.o.  G2P0010 G2P1A1L1  RP: Preop Robotic TLH/Bilateral Salpingectomy, possible lysis of adhesions  HPI: No change in symptoms x last visit.   OB History  Gravida Para Term Preterm AB Living  2 1     1     SAB TAB Ectopic Multiple Live Births      1        # Outcome Date GA Lbr Len/2nd Weight Sex Delivery Anes PTL Lv  2 Para           1 Ectopic             Past medical history,surgical history, problem list, medications, allergies, family history and social history were all reviewed and documented in the EPIC chart.   Directed ROS with pertinent positives and negatives documented in the history of present illness/assessment and plan.  Exam:  Vitals:   09/17/18 0830  BP: 140/88   General appearance:  Normal  Pelvic US at [facility] 07/30/2018: Uterus: Size = 10.4 x 5.4 x 6.5 cm. Multiple fibroids identified. Index lesions include an anterior intramural lesion measuring up to 2.9 cm, posterior wall measuring up to 2.6 cm, and posterior wall inferiorly with probable subserosal component measuring up to 1.8 cm. Endometrium is not clearly seen due to shadowing, but estimated 4 mm in width. No significant distortion of the endometrial canal aside from the nonvisualized fundal aspect. .Right ovary/adnexa: Size = 3.3 x 3.1 x 2.8 cm, estimated volume 15 cc. Simple appearing functional cyst measuring up to 3.0 cm. .Left ovary/adnexa: Not visualized. .Peritoneum: No fluid in the cul-de-sac. .Right ovary: Arterial and venous blood flow are documented. .Left ovary: Not assessed.   Assessment/Plan:  47 y.o. G2P0010   1. Chronic pelvic pain in female Chronic pelvic pain with uterine fibroids, not improved on birth control pills.  History of ruptured ectopic pregnancies and ovarian cystectomies.  Decision to proceed with robotic TLH with bilateral salpingectomy, possible lysis of adhesions.  Surgery and risks thoroughly reviewed  with patient including risks of trauma, infection, hemorrhage, anesthesiology risks.  Preop preparation and postop management reviewed with patient.  All questions answered.  Patient agrees with plan.  2. Intramural and subserous leiomyoma of uterus As above.                        Patient was counseled as to the risk of surgery to include the following:  1. Infection (prohylactic antibiotics will be administered)  2. DVT/Pulmonary Embolism (prophylactic pneumo compression stockings will be used)  3.Trauma to internal organs requiring additional surgical procedure to repair any injury to internal organs requiring perhaps additional hospitalization days.  4.Hemmorhage requiring transfusion and blood products which carry risks such as anaphylactic reaction, hepatitis and AIDS  Patient had received literature information on the procedure scheduled and all her questions were answered and fully accepts all risk.   [name] MD, 8:44 AM 09/17/2018

## 2018-09-17 NOTE — Patient Instructions (Signed)
1. Chronic pelvic pain in female Chronic pelvic pain with uterine fibroids, not improved on birth control pills.  History of ruptured ectopic pregnancies and ovarian cystectomies.  Decision to proceed with robotic TLH with bilateral salpingectomy, possible lysis of adhesions.  Surgery and risks thoroughly reviewed with patient including risks of trauma, infection, hemorrhage, anesthesiology risks.  Preop preparation and postop management reviewed with patient.  All questions answered.  Patient agrees with plan.  2. Intramural and subserous leiomyoma of uterus As above.                        Patient was counseled as to the risk of surgery to include the following:  1. Infection (prohylactic antibiotics will be administered)  2. DVT/Pulmonary Embolism (prophylactic pneumo compression stockings will be used)  3.Trauma to internal organs requiring additional surgical procedure to repair any injury to internal organs requiring perhaps additional hospitalization days.  4.Hemmorhage requiring transfusion and blood products which carry risks such as anaphylactic reaction, hepatitis and AIDS  Patient had received literature information on the procedure scheduled and all her questions were answered and fully accepts all risk.  Levada Dy, good seeing you this morning!

## 2018-09-22 ENCOUNTER — Encounter: Payer: Self-pay | Admitting: Anesthesiology

## 2018-09-28 NOTE — Patient Instructions (Addendum)
Stefanie Baldwin  09/28/2018   Your procedure is scheduled on: 10-06-18   Report to [facility] Main  Entrance    Report to admitting at 5:30AM    Call this number if you have problems the morning of surgery [phone]     Remember: Do not eat food or drink liquids :After Midnight. BRUSH YOUR TEETH MORNING OF SURGERY AND RINSE YOUR MOUTH OUT, NO CHEWING GUM CANDY OR MINTS.     Take these medicines the morning of surgery with A SIP OF WATER: zyrtec, albuterol inhaler if needed                                You may not have any metal on your body including hair pins and              piercings  Do not wear jewelry, make-up, lotions, powders or perfumes, deodorant             Do not wear nail polish.  Do not shave  48 hours prior to surgery.           Do not bring valuables to the hospital. [place].  Contacts, dentures or bridgework may not be worn into surgery.  Leave suitcase in the car. After surgery it may be brought to your room.                 Please read over the following fact sheets you were given: _____________________________________________________________________             [facility] - Preparing for Surgery Before surgery, you can play an important role.  Because skin is not sterile, your skin needs to be as free of germs as possible.  You can reduce the number of germs on your skin by washing with CHG (chlorahexidine gluconate) soap before surgery.  CHG is an antiseptic cleaner which kills germs and bonds with the skin to continue killing germs even after washing. Please DO NOT use if you have an allergy to CHG or antibacterial soaps.  If your skin becomes reddened/irritated stop using the CHG and inform your nurse when you arrive at Short Stay. Do not shave (including legs and underarms) for at least 48 hours prior to the first CHG shower.  You may shave your face/neck. Please follow  these instructions carefully:  1.  Shower with CHG Soap the night before surgery and the  morning of Surgery.  2.  If you choose to wash your hair, wash your hair first as usual with your  normal  shampoo.  3.  After you shampoo, rinse your hair and body thoroughly to remove the  shampoo.                           4.  Use CHG as you would any other liquid soap.  You can apply chg directly  to the skin and wash                       Gently with a scrungie or clean washcloth.  5.  Apply the CHG Soap to your body ONLY FROM THE NECK DOWN.   Do not  use on face/ open                           Wound or open sores. Avoid contact with eyes, ears mouth and genitals (private parts).                       Wash face,  Genitals (private parts) with your normal soap.             6.  Wash thoroughly, paying special attention to the area where your surgery  will be performed.  7.  Thoroughly rinse your body with warm water from the neck down.  8.  DO NOT shower/wash with your normal soap after using and rinsing off  the CHG Soap.                9.  Pat yourself dry with a clean towel.            10.  Wear clean pajamas.            11.  Place clean sheets on your bed the night of your first shower and do not  sleep with pets. Day of Surgery : Do not apply any lotions/deodorants the morning of surgery.  Please wear clean clothes to the hospital/surgery center.  FAILURE TO FOLLOW THESE INSTRUCTIONS MAY RESULT IN THE CANCELLATION OF YOUR SURGERY PATIENT SIGNATURE_________________________________  NURSE SIGNATURE__________________________________  ________________________________________________________________________   Stefanie Baldwin  An incentive spirometer is a tool that can help keep your lungs clear and active. This tool measures how well you are filling your lungs with each breath. Taking long deep breaths may help reverse or decrease the chance of developing breathing (pulmonary) problems  (especially infection) following:  A long period of time when you are unable to move or be active. BEFORE THE PROCEDURE   If the spirometer includes an indicator to show your best effort, your nurse or respiratory therapist will set it to a desired goal.  If possible, sit up straight or lean slightly forward. Try not to slouch.  Hold the incentive spirometer in an upright position. INSTRUCTIONS FOR USE  1. Sit on the edge of your bed if possible, or sit up as far as you can in bed or on a chair. 2. Hold the incentive spirometer in an upright position. 3. Breathe out normally. 4. Place the mouthpiece in your mouth and seal your lips tightly around it. 5. Breathe in slowly and as deeply as possible, raising the piston or the ball toward the top of the column. 6. Hold your breath for 3-5 seconds or for as long as possible. Allow the piston or ball to fall to the bottom of the column. 7. Remove the mouthpiece from your mouth and breathe out normally. 8. Rest for a few seconds and repeat Steps 1 through 7 at least 10 times every 1-2 hours when you are awake. Take your time and take a few normal breaths between deep breaths. 9. The spirometer may include an indicator to show your best effort. Use the indicator as a goal to work toward during each repetition. 10. After each set of 10 deep breaths, practice coughing to be sure your lungs are clear. If you have an incision (the cut made at the time of surgery), support your incision when coughing by placing a pillow or rolled up towels firmly against it. Once you are able to get out of bed, walk  around indoors and cough well. You may stop using the incentive spirometer when instructed by your caregiver.  RISKS AND COMPLICATIONS  Take your time so you do not get dizzy or light-headed.  If you are in pain, you may need to take or ask for pain medication before doing incentive spirometry. It is harder to take a deep breath if you are having  pain. AFTER USE  Rest and breathe slowly and easily.  It can be helpful to keep track of a log of your progress. Your caregiver can provide you with a simple table to help with this. If you are using the spirometer at home, follow these instructions: [place] IF:   You are having difficultly using the spirometer.  You have trouble using the spirometer as often as instructed.  Your pain medication is not giving enough relief while using the spirometer.  You develop fever of 100.5 F (38.1 C) or higher. SEEK IMMEDIATE MEDICAL CARE IF:   You cough up bloody sputum that had not been present before.  You develop fever of 102 F (38.9 C) or greater.  You develop worsening pain at or near the incision site. MAKE SURE YOU:   Understand these instructions.  Will watch your condition.  Will get help right away if you are not doing well or get worse. Document Released: 04/14/2007 Document Revised: 02/24/2012 Document Reviewed: 06/15/2007 [facility] Patient Information 2014 [place], [place].   ________________________________________________________________________

## 2018-09-29 ENCOUNTER — Encounter (HOSPITAL_COMMUNITY): Payer: Self-pay

## 2018-09-29 ENCOUNTER — Encounter (INDEPENDENT_AMBULATORY_CARE_PROVIDER_SITE_OTHER): Payer: Self-pay

## 2018-09-29 ENCOUNTER — Other Ambulatory Visit: Payer: Self-pay

## 2018-09-29 ENCOUNTER — Encounter (HOSPITAL_COMMUNITY)
Admission: RE | Admit: 2018-09-29 | Discharge: 2018-09-29 | Disposition: A | Discharge status: Home / Self Care | Payer: 59 | Source: Ambulatory Visit | Attending: Obstetrics & Gynecology | Admitting: Obstetrics & Gynecology

## 2018-09-29 DIAGNOSIS — R102 Pelvic and perineal pain: Secondary | ICD-10-CM | POA: Diagnosis not present

## 2018-09-29 DIAGNOSIS — D259 Leiomyoma of uterus, unspecified: Secondary | ICD-10-CM | POA: Diagnosis not present

## 2018-09-29 DIAGNOSIS — Z01818 Encounter for other preprocedural examination: Secondary | ICD-10-CM | POA: Diagnosis present

## 2018-09-29 HISTORY — DX: Benign neoplasm of connective and other soft tissue, unspecified: D21.9

## 2018-09-29 LAB — CBC
HEMATOCRIT: 42.8 % (ref 36.0–46.0)
Hemoglobin: 13.8 g/dL (ref 12.0–15.0)
MCH: 29.9 pg (ref 26.0–34.0)
MCHC: 32.2 g/dL (ref 30.0–36.0)
MCV: 92.8 fL (ref 80.0–100.0)
Platelets: 247 10*3/uL (ref 150–400)
RBC: 4.61 MIL/uL (ref 3.87–5.11)
RDW: 12.7 % (ref 11.5–15.5)
WBC: 4.8 10*3/uL (ref 4.0–10.5)
nRBC: 0 % (ref 0.0–0.2)

## 2018-09-29 LAB — PREGNANCY, URINE: Preg Test, Ur: NEGATIVE

## 2018-09-29 LAB — NO BLOOD PRODUCTS

## 2018-09-29 NOTE — Progress Notes (Signed)
Signed Blood refusal form sent via fax to Dr Dellis Filbert. Fax receipt placed on chart

## 2018-10-02 ENCOUNTER — Encounter: Payer: Self-pay | Admitting: Anesthesiology

## 2018-10-06 ENCOUNTER — Encounter (HOSPITAL_COMMUNITY)
Admission: RE | Disposition: A | Discharge status: Home / Self Care | Payer: Self-pay | Source: Ambulatory Visit | Attending: Obstetrics & Gynecology

## 2018-10-06 ENCOUNTER — Ambulatory Visit (HOSPITAL_COMMUNITY): Payer: 59 | Admitting: Anesthesiology

## 2018-10-06 ENCOUNTER — Ambulatory Visit (HOSPITAL_COMMUNITY)
Admission: RE | Admit: 2018-10-06 | Discharge: 2018-10-07 | Disposition: A | Discharge status: Home / Self Care | Payer: 59 | Source: Ambulatory Visit | Attending: Obstetrics & Gynecology | Admitting: Obstetrics & Gynecology

## 2018-10-06 ENCOUNTER — Encounter (HOSPITAL_COMMUNITY): Payer: Self-pay | Admitting: *Deleted

## 2018-10-06 DIAGNOSIS — K219 Gastro-esophageal reflux disease without esophagitis: Secondary | ICD-10-CM | POA: Insufficient documentation

## 2018-10-06 DIAGNOSIS — K660 Peritoneal adhesions (postprocedural) (postinfection): Secondary | ICD-10-CM | POA: Insufficient documentation

## 2018-10-06 DIAGNOSIS — N920 Excessive and frequent menstruation with regular cycle: Secondary | ICD-10-CM

## 2018-10-06 DIAGNOSIS — D251 Intramural leiomyoma of uterus: Secondary | ICD-10-CM | POA: Diagnosis not present

## 2018-10-06 DIAGNOSIS — G8929 Other chronic pain: Secondary | ICD-10-CM | POA: Insufficient documentation

## 2018-10-06 DIAGNOSIS — Z9889 Other specified postprocedural states: Secondary | ICD-10-CM

## 2018-10-06 DIAGNOSIS — D252 Subserosal leiomyoma of uterus: Secondary | ICD-10-CM | POA: Diagnosis not present

## 2018-10-06 DIAGNOSIS — Z79899 Other long term (current) drug therapy: Secondary | ICD-10-CM | POA: Insufficient documentation

## 2018-10-06 DIAGNOSIS — Z6841 Body Mass Index (BMI) 40.0 and over, adult: Secondary | ICD-10-CM | POA: Diagnosis not present

## 2018-10-06 DIAGNOSIS — N921 Excessive and frequent menstruation with irregular cycle: Secondary | ICD-10-CM | POA: Insufficient documentation

## 2018-10-06 DIAGNOSIS — R102 Pelvic and perineal pain: Secondary | ICD-10-CM | POA: Insufficient documentation

## 2018-10-06 DIAGNOSIS — D259 Leiomyoma of uterus, unspecified: Secondary | ICD-10-CM

## 2018-10-06 HISTORY — PX: ROBOTIC ASSISTED LAPAROSCOPIC HYSTERECTOMY AND SALPINGECTOMY: SHX6379

## 2018-10-06 HISTORY — PX: ROBOTIC ASSISTED LAPAROSCOPIC LYSIS OF ADHESION: SHX6080

## 2018-10-06 SURGERY — XI ROBOTIC ASSISTED LAPAROSCOPIC HYSTERECTOMY AND SALPINGECTOMY
Anesthesia: General | Laterality: Bilateral

## 2018-10-06 MED ORDER — ROCURONIUM BROMIDE 10 MG/ML (PF) SYRINGE
PREFILLED_SYRINGE | INTRAVENOUS | Status: AC
  Filled 2018-10-06: qty 10

## 2018-10-06 MED ORDER — MIDAZOLAM HCL 2 MG/2ML IJ SOLN
INTRAMUSCULAR | Status: DC | PRN
  Administered 2018-10-06: 2 mg via INTRAVENOUS

## 2018-10-06 MED ORDER — LIDOCAINE 2% (20 MG/ML) 5 ML SYRINGE
INTRAMUSCULAR | Status: DC | PRN
  Administered 2018-10-06: 1.5 mg/kg/h via INTRAVENOUS

## 2018-10-06 MED ORDER — SUGAMMADEX SODIUM 500 MG/5ML IV SOLN
INTRAVENOUS | Status: AC
  Filled 2018-10-06: qty 5

## 2018-10-06 MED ORDER — ONDANSETRON HCL 4 MG/2ML IJ SOLN
INTRAMUSCULAR | Status: DC | PRN
  Administered 2018-10-06: 4 mg via INTRAVENOUS

## 2018-10-06 MED ORDER — MIDAZOLAM HCL 2 MG/2ML IJ SOLN
INTRAMUSCULAR | Status: AC
  Filled 2018-10-06: qty 2

## 2018-10-06 MED ORDER — ONDANSETRON HCL 4 MG/2ML IJ SOLN
4.0000 mg | Freq: Four times a day (QID) | INTRAMUSCULAR | Status: DC | PRN
  Administered 2018-10-06: 4 mg via INTRAVENOUS

## 2018-10-06 MED ORDER — PROMETHAZINE HCL 25 MG/ML IJ SOLN: 6.2500 mg | INTRAMUSCULAR | Status: DC | PRN

## 2018-10-06 MED ORDER — DEXAMETHASONE SODIUM PHOSPHATE 10 MG/ML IJ SOLN
INTRAMUSCULAR | Status: AC
  Filled 2018-10-06: qty 1

## 2018-10-06 MED ORDER — SCOPOLAMINE 1 MG/3DAYS TD PT72
MEDICATED_PATCH | TRANSDERMAL | Status: DC | PRN
  Administered 2018-10-06: 1 via TRANSDERMAL

## 2018-10-06 MED ORDER — ALBUTEROL SULFATE HFA 108 (90 BASE) MCG/ACT IN AERS: 2.0000 | INHALATION_SPRAY | Freq: Four times a day (QID) | RESPIRATORY_TRACT | Status: DC | PRN

## 2018-10-06 MED ORDER — BUPIVACAINE HCL (PF) 0.25 % IJ SOLN
INTRAMUSCULAR | Status: DC | PRN
  Administered 2018-10-06: 15 mL

## 2018-10-06 MED ORDER — KETOROLAC TROMETHAMINE 30 MG/ML IJ SOLN
INTRAMUSCULAR | Status: AC
  Filled 2018-10-06: qty 1

## 2018-10-06 MED ORDER — OXYCODONE-ACETAMINOPHEN 5-325 MG PO TABS
1.0000 | ORAL_TABLET | ORAL | Status: DC | PRN
  Administered 2018-10-06 (×2): 1 via ORAL

## 2018-10-06 MED ORDER — HYDROMORPHONE HCL 1 MG/ML IJ SOLN
0.2500 mg | INTRAMUSCULAR | Status: DC | PRN
  Administered 2018-10-06: 0.25 mg via INTRAVENOUS
  Administered 2018-10-06 (×3): 0.5 mg via INTRAVENOUS
  Administered 2018-10-06: 0.25 mg via INTRAVENOUS

## 2018-10-06 MED ORDER — HYDROMORPHONE HCL 1 MG/ML IJ SOLN
INTRAMUSCULAR | Status: AC
  Filled 2018-10-06: qty 1

## 2018-10-06 MED ORDER — OXYCODONE-ACETAMINOPHEN 5-325 MG PO TABS
ORAL_TABLET | ORAL | Status: AC
  Filled 2018-10-06: qty 1

## 2018-10-06 MED ORDER — ROCURONIUM BROMIDE 10 MG/ML (PF) SYRINGE
PREFILLED_SYRINGE | INTRAVENOUS | Status: DC | PRN
  Administered 2018-10-06: 20 mg via INTRAVENOUS
  Administered 2018-10-06: 10 mg via INTRAVENOUS
  Administered 2018-10-06: 50 mg via INTRAVENOUS

## 2018-10-06 MED ORDER — IBUPROFEN 200 MG PO TABS
600.0000 mg | ORAL_TABLET | Freq: Four times a day (QID) | ORAL | Status: DC | PRN
  Administered 2018-10-06 – 2018-10-07 (×3): 600 mg via ORAL

## 2018-10-06 MED ORDER — DEXAMETHASONE SODIUM PHOSPHATE 10 MG/ML IJ SOLN
INTRAMUSCULAR | Status: DC | PRN
  Administered 2018-10-06: 10 mg via INTRAVENOUS

## 2018-10-06 MED ORDER — SCOPOLAMINE 1 MG/3DAYS TD PT72
MEDICATED_PATCH | TRANSDERMAL | Status: AC
  Filled 2018-10-06: qty 1

## 2018-10-06 MED ORDER — PROPOFOL 10 MG/ML IV BOLUS
INTRAVENOUS | Status: DC | PRN
  Administered 2018-10-06: 200 mg via INTRAVENOUS

## 2018-10-06 MED ORDER — CEFAZOLIN SODIUM-DEXTROSE 2-4 GM/100ML-% IV SOLN
2.0000 g | INTRAVENOUS | Status: AC
  Administered 2018-10-06: 2 g via INTRAVENOUS
  Filled 2018-10-06: qty 100

## 2018-10-06 MED ORDER — HYDROMORPHONE HCL 1 MG/ML IJ SOLN
0.5000 mg | INTRAMUSCULAR | Status: DC | PRN
  Administered 2018-10-06 (×2): 0.5 mg via INTRAVENOUS

## 2018-10-06 MED ORDER — OXYCODONE HCL 5 MG PO TABS: 5.0000 mg | ORAL_TABLET | Freq: Once | ORAL | Status: DC | PRN

## 2018-10-06 MED ORDER — PROPOFOL 10 MG/ML IV BOLUS
INTRAVENOUS | Status: AC
  Filled 2018-10-06: qty 20

## 2018-10-06 MED ORDER — LIDOCAINE 2% (20 MG/ML) 5 ML SYRINGE
INTRAMUSCULAR | Status: DC | PRN
  Administered 2018-10-06: 60 mg via INTRAVENOUS

## 2018-10-06 MED ORDER — ONDANSETRON HCL 4 MG/2ML IJ SOLN
INTRAMUSCULAR | Status: AC
  Filled 2018-10-06: qty 2

## 2018-10-06 MED ORDER — SUGAMMADEX SODIUM 200 MG/2ML IV SOLN
INTRAVENOUS | Status: DC | PRN
  Administered 2018-10-06: 230 mg via INTRAVENOUS

## 2018-10-06 MED ORDER — KETOROLAC TROMETHAMINE 30 MG/ML IJ SOLN
INTRAMUSCULAR | Status: DC | PRN
  Administered 2018-10-06: 30 mg via INTRAVENOUS

## 2018-10-06 MED ORDER — LIDOCAINE 2% (20 MG/ML) 5 ML SYRINGE
INTRAMUSCULAR | Status: AC
  Filled 2018-10-06: qty 5

## 2018-10-06 MED ORDER — FENTANYL CITRATE (PF) 100 MCG/2ML IJ SOLN
INTRAMUSCULAR | Status: DC | PRN
  Administered 2018-10-06: 25 ug via INTRAVENOUS
  Administered 2018-10-06: 50 ug via INTRAVENOUS
  Administered 2018-10-06 (×2): 25 ug via INTRAVENOUS
  Administered 2018-10-06: 50 ug via INTRAVENOUS
  Administered 2018-10-06 (×3): 25 ug via INTRAVENOUS

## 2018-10-06 MED ORDER — MEPERIDINE HCL 50 MG/ML IJ SOLN: 6.2500 mg | INTRAMUSCULAR | Status: DC | PRN

## 2018-10-06 MED ORDER — HYDROMORPHONE HCL 1 MG/ML IJ SOLN
INTRAMUSCULAR | Status: AC
  Filled 2018-10-06: qty 2

## 2018-10-06 MED ORDER — LACTATED RINGERS IV SOLN
INTRAVENOUS | Status: DC
  Administered 2018-10-06 – 2018-10-07 (×3): via INTRAVENOUS

## 2018-10-06 MED ORDER — ARTIFICIAL TEARS OPHTHALMIC OINT
TOPICAL_OINTMENT | OPHTHALMIC | Status: AC
  Filled 2018-10-06: qty 3.5

## 2018-10-06 MED ORDER — LACTATED RINGERS IV SOLN
INTRAVENOUS | Status: DC
  Administered 2018-10-06 (×2): via INTRAVENOUS

## 2018-10-06 MED ORDER — OXYCODONE HCL 5 MG/5ML PO SOLN: 5.0000 mg | Freq: Once | ORAL | Status: DC | PRN

## 2018-10-06 MED ORDER — FENTANYL CITRATE (PF) 250 MCG/5ML IJ SOLN
INTRAMUSCULAR | Status: AC
  Filled 2018-10-06: qty 5

## 2018-10-06 MED ORDER — LORATADINE 10 MG PO TABS: 10.0000 mg | ORAL_TABLET | Freq: Every day | ORAL | Status: DC

## 2018-10-06 MED ORDER — IBUPROFEN 200 MG PO TABS
ORAL_TABLET | ORAL | Status: AC
  Filled 2018-10-06: qty 3

## 2018-10-06 SURGICAL SUPPLY — 61 items
ADH SKN CLS APL DERMABOND .7 (GAUZE/BANDAGES/DRESSINGS) ×2
BARRIER ADHS 3X4 INTERCEED (GAUZE/BANDAGES/DRESSINGS) IMPLANT
BRR ADH 4X3 ABS CNTRL BYND (GAUZE/BANDAGES/DRESSINGS)
CANISTER SUCT 3000ML PPV (MISCELLANEOUS) ×3 IMPLANT
CATH FOLEY 3WAY  5CC 16FR (CATHETERS) ×1
CATH FOLEY 3WAY 5CC 16FR (CATHETERS) ×2 IMPLANT
COVER BACK TABLE 60X90IN (DRAPES) ×3 IMPLANT
COVER TIP SHEARS 8 DVNC (MISCELLANEOUS) ×2 IMPLANT
COVER TIP SHEARS 8MM DA VINCI (MISCELLANEOUS) ×1
DECANTER SPIKE VIAL GLASS SM (MISCELLANEOUS) ×4 IMPLANT
DEFOGGER SCOPE WARMER CLEARIFY (MISCELLANEOUS) ×3 IMPLANT
DERMABOND ADVANCED (GAUZE/BANDAGES/DRESSINGS) ×1
DERMABOND ADVANCED .7 DNX12 (GAUZE/BANDAGES/DRESSINGS) ×2 IMPLANT
DRAPE ARM DVNC X/XI (DISPOSABLE) ×8 IMPLANT
DRAPE COLUMN DVNC XI (DISPOSABLE) ×2 IMPLANT
DRAPE DA VINCI XI ARM (DISPOSABLE) ×4
DRAPE DA VINCI XI COLUMN (DISPOSABLE) ×1
DURAPREP 26ML APPLICATOR (WOUND CARE) ×3 IMPLANT
ELECT REM PT RETURN 15FT ADLT (MISCELLANEOUS) ×3 IMPLANT
GAUZE VASELINE 1X8 (GAUZE/BANDAGES/DRESSINGS) ×3 IMPLANT
GLOVE BIO SURGEON STRL SZ 6.5 (GLOVE) ×9 IMPLANT
GLOVE BIO SURGEON STRL SZ8 (GLOVE) ×1 IMPLANT
GLOVE BIOGEL PI IND STRL 7.0 (GLOVE) ×10 IMPLANT
GLOVE BIOGEL PI IND STRL 8 (GLOVE) IMPLANT
GLOVE BIOGEL PI INDICATOR 7.0 (GLOVE) ×4
GLOVE BIOGEL PI INDICATOR 8 (GLOVE) ×1
IRRIG SUCT STRYKERFLOW 2 WTIP (MISCELLANEOUS) ×3
IRRIGATION SUCT STRKRFLW 2 WTP (MISCELLANEOUS) ×2 IMPLANT
LEGGING LITHOTOMY PAIR STRL (DRAPES) ×3 IMPLANT
OBTURATOR OPTICAL STANDARD 8MM (TROCAR) ×1
OBTURATOR OPTICAL STND 8 DVNC (TROCAR) ×2
OBTURATOR OPTICALSTD 8 DVNC (TROCAR) ×2 IMPLANT
OCCLUDER COLPOPNEUMO (BALLOONS) ×3 IMPLANT
PACK ROBOT WH (CUSTOM PROCEDURE TRAY) ×3 IMPLANT
PACK ROBOTIC GOWN (GOWN DISPOSABLE) ×3 IMPLANT
PACK TRENDGUARD 450 HYBRID PRO (MISCELLANEOUS) IMPLANT
PAD PREP 24X48 CUFFED NSTRL (MISCELLANEOUS) ×3 IMPLANT
POSITIONER SURGICAL ARM (MISCELLANEOUS) ×6 IMPLANT
POUCH ENDO CATCH II 15MM (MISCELLANEOUS) IMPLANT
RTRCTR WOUND ALEXIS 18CM SML (INSTRUMENTS)
SAVER CELL AAL HAEMONETICS (INSTRUMENTS) IMPLANT
SEAL CANN UNIV 5-8 DVNC XI (MISCELLANEOUS) ×6 IMPLANT
SEAL XI 5MM-8MM UNIVERSAL (MISCELLANEOUS) ×3
SET CYSTO W/LG BORE CLAMP LF (SET/KITS/TRAYS/PACK) IMPLANT
SET TRI-LUMEN FLTR TB AIRSEAL (TUBING) ×3 IMPLANT
SUT ETHIBOND 0 (SUTURE) IMPLANT
SUT VIC AB 4-0 PS2 27 (SUTURE) ×9 IMPLANT
SUT VICRYL 0 UR6 27IN ABS (SUTURE) ×3 IMPLANT
SUT VLOC 180 0 9IN  GS21 (SUTURE) ×1
SUT VLOC 180 0 9IN GS21 (SUTURE) ×2 IMPLANT
SUT VLOC 180 2-0 6IN GS21 (SUTURE) IMPLANT
TIP RUMI ORANGE 6.7MMX12CM (TIP) IMPLANT
TIP UTERINE 5.1X6CM LAV DISP (MISCELLANEOUS) IMPLANT
TIP UTERINE 6.7X10CM GRN DISP (MISCELLANEOUS) IMPLANT
TIP UTERINE 6.7X6CM WHT DISP (MISCELLANEOUS) IMPLANT
TIP UTERINE 6.7X8CM BLUE DISP (MISCELLANEOUS) ×1 IMPLANT
TOWEL OR 17X26 10 PK STRL BLUE (TOWEL DISPOSABLE) ×3 IMPLANT
TRENDGUARD 450 HYBRID PRO PACK (MISCELLANEOUS) ×3
TROCAR HASSON GELL 12X100 (TROCAR) IMPLANT
TROCAR PORT AIRSEAL 5X120 (TROCAR) ×3 IMPLANT
WATER STERILE IRR 1000ML POUR (IV SOLUTION) ×3 IMPLANT

## 2018-10-06 NOTE — H&P (Signed)
@LOGO @  Stefanie Baldwin is an 47 y.o. female. [id number]  RP: Robotic TLH/Bilateral Salpingectomy, possible lysis of adhesions for longstanding pelvic pain/Fibroids  HPI: Patient had oligomenorrhea in the spring of 2018.  May 23, 2017 TSH normal, prolactin normal, FSH normal/premenopausal at 9.4.  Patient's menstrual period then resumed a normal monthly pattern with normal flow and no metrorrhagia.  Patient has suffered from chronic midline pelvic pain for at least 5 years. She takes ibuprofen frequently.  Her pain is a crampy/pressure pain present most days.  The pain is not much different whether she is on her menstrual period or not.  Patient consulted at [facility] and was started on Enpress 28 in 03/2018.  The pill did not change her pain or menstrual periods.  A pelvic ultrasound was done July 30, 2018 showing multiple fibroids, 3 of which were measured at 1.8 cm to 2.9 cm, the endometrium appeared thin at 4 mm, the right ovary was normal and the left ovary was not visualized.  Patient was also evaluated by a urologist for recurrent symptoms of UTI, both her urine culture was negative.  Remote history per patient of interstitial cystitis, but this diagnosis was not entertained at the time of her recent urology evaluation.  No recent cystoscopy done.  Bowel movements normal.  No fever. Patient has a history of a ruptured ectopic pregnancy and laparoscopies for ovarian cystectomies.                                                                                   Pertinent Gynecological History: Menses: normal monthly Contraception: vasectomy/BCPs Blood transfusions: none Sexually transmitted diseases: no past history Last mammogram: normal  Last pap: normal    Menstrual History: Patient's last menstrual period was 09/27/2018.    Past Medical History:  Diagnosis Date  . Complication of anesthesia   . Ectopic pregnancy   . Fibroids    uterine   . GERD (gastroesophageal reflux disease)     uses OTC meds prn  . Mass    on neck and chest  . Obesity   . Pneumonia 2 years ago   . PONV (postoperative nausea and vomiting)    "severe" PONV ; does well with "patch and IV antinausea"     Past Surgical History:  Procedure Laterality Date  . CYST REMOVAL HAND     wrist  . EXCISION MASS NECK N/A 12/01/2017   Procedure: EXCISION MASS POSTERIOR NECK;  Surgeon: Cristine Polio, MD;  Location: [place];  Service: Plastics;  Laterality: N/A;  . FOOT SURGERY    . LIPOSUCTION N/A 12/01/2017   Procedure: LIPOSUCTION;  Surgeon: Cristine Polio, MD;  Location: [place];  Service: Plastics;  Laterality: N/A;  . OVARIAN CYST REMOVAL    . REDUCTION MAMMAPLASTY Bilateral     Family History  Problem Relation Age of Onset  . Thyroid disease Mother   . Diabetes Mother   . Hypertension Mother   . Heart disease Mother   . Diabetes Father   . Thyroid disease Brother   . Colon cancer Neg Hx   . Stomach cancer Neg Hx   . Rectal cancer Neg  Hx   . Esophageal cancer Neg Hx   . Liver cancer Neg Hx   . Breast cancer Neg Hx     Social History:  reports that she has never smoked. She has never used smokeless tobacco. She reports that she does not drink alcohol or use drugs.  Allergies:  Allergies  Allergen Reactions  . Other     Pt does not want to receive any BLOOD     Medications Prior to Admission  Medication Sig Dispense Refill Last Dose  . albuterol (PROAIR HFA) 108 (90 Base) MCG/ACT inhaler Inhale 2 puffs into the lungs every 6 (six) hours as needed for wheezing or shortness of breath.      . betamethasone valerate (VALISONE) 0.1 % cream Apply 1 application topically daily as needed (eczema).   6 Taking  . cetirizine (ZYRTEC) 10 MG tablet Take 10 mg by mouth daily.   Taking  . ibuprofen (ADVIL,MOTRIN) 200 MG tablet Take 600-800 mg by mouth every 8 (eight) hours as needed for mild pain or moderate pain.     Marland Kitchen levonorgestrel-ethinyl estradiol  (ENPRESSE-28) tablet Take 1 tablet by mouth daily. 84 tablet 0 Taking  . Vitamin D, Ergocalciferol, (DRISDOL) 50000 UNITS CAPS capsule Take 50,000 Units by mouth every 7 (seven) days. Sunday  3 Taking    REVIEW OF SYSTEMS: A ROS was performed and pertinent positives and negatives are included in the history.  GENERAL: No fevers or chills. HEENT: No change in vision, no earache, sore throat or sinus congestion. NECK: No pain or stiffness. CARDIOVASCULAR: No chest pain or pressure. No palpitations. PULMONARY: No shortness of breath, cough or wheeze. GASTROINTESTINAL: No abdominal pain, nausea, vomiting or diarrhea, melena or bright red blood per rectum. GENITOURINARY: No urinary frequency, urgency, hesitancy or dysuria. MUSCULOSKELETAL: No joint or muscle pain, no back pain, no recent trauma. DERMATOLOGIC: No rash, no itching, no lesions. ENDOCRINE: No polyuria, polydipsia, no heat or cold intolerance. No recent change in weight. HEMATOLOGICAL: No anemia or easy bruising or bleeding. NEUROLOGIC: No headache, seizures, numbness, tingling or weakness. PSYCHIATRIC: No depression, no loss of interest in normal activity or change in sleep pattern.    Last menstrual period 09/27/2018.  Physical Exam:  See office notes  [facility]el[facility] at [facility] 07[facility]30/2018: Uterus: Size = 10.4 x 5.4 x 6.5 cm. Multiple fibroids identified. Index lesions include an anterior intramural lesion measuring up to 2.9 cm, posterior wall measuring up to 2.6 cm, and posterior wall inferiorly with probable subserosal component measuring up to 1.8 cm. Endometrium is not clearly seen due to shadowing, but estimated 4 mm in width. No significant distortion of the endometrial canal aside from the nonvisualized fundal aspect. .Right ovary/adnexa: Size = 3.3 x 3.1 x 2.8 cm, estimated volume 15 cc. Simple appearing functional cyst measuring up to 3.0 cm. .Left ovary/adnexa: Not visualized. .Peritoneum: No fluid in the  cul-de-sac. .Right ovary: Arterial and venous blood flow are documented. .Left ovary: Not assessed.   Assessment/Plan:  47 y.o. G2P0010   1. Chronic pelvic pain in female Chronic pelvic pain with uterine fibroids, not improved on birth control pills.  History of ruptured ectopic pregnancies and ovarian cystectomies.  Decision to proceed with robotic TLH with bilateral salpingectomy, possible lysis of adhesions.  Surgery and risks thoroughly reviewed with patient including risks of trauma, infection, hemorrhage, anesthesiology risks.  Preop preparation and postop management reviewed with patient.  All questions answered.  Patient agrees with plan.  2. Intramural and subserous leiomyoma of  uterus As above.                        Patient was counseled as to the risk of surgery to include the following:  1. Infection (prohylactic antibiotics will be administered)  2. DVT/Pulmonary Embolism (prophylactic pneumo compression stockings will be used)  3.Trauma to internal organs requiring additional surgical procedure to repair any injury to internal organs requiring perhaps additional hospitalization days.  4.Hemmorhage requiring transfusion and blood products which carry risks such as anaphylactic reaction, hepatitis and AIDS  Patient had received literature information on the procedure scheduled and all her questions were answered and fully accepts all risk.   Marie-Lyne Lovel Suazo 10/06/2018, 6:00 AM

## 2018-10-06 NOTE — Transfer of Care (Signed)
Immediate Anesthesia Transfer of Care Note  Patient: CLEOTILDE SPADACCINI  Procedure(s) Performed: Procedure(s) (LRB): XI ROBOTIC ASSISTED TOTAL LAPAROSCOPIC HYSTERECTOMY AND SALPINGECTOMY (Bilateral) XI ROBOTIC ASSISTED LAPAROSCOPIC LYSIS OF ADHESION  Patient Location: PACU  Anesthesia Type: General  Level of Consciousness: awake, oriented, sedated and patient cooperative  Airway & Oxygen Therapy: Patient Spontanous Breathing and Patient connected to face mask oxygen  Post-op Assessment: Report given to PACU RN and Post -op Vital signs reviewed and stable  Post vital signs: Reviewed and stable  Complications: No apparent anesthesia complications  Last Vitals:  Vitals Value Taken Time  BP 140/90 10/06/2018  9:45 AM  Temp    Pulse 100 10/06/2018  9:48 AM  Resp 22 10/06/2018  9:48 AM  SpO2 99 % 10/06/2018  9:48 AM  Vitals shown include unvalidated device data.  Last Pain:  Vitals:   10/06/18 0559  TempSrc: Oral  PainSc: 0-No pain

## 2018-10-06 NOTE — Anesthesia Postprocedure Evaluation (Signed)
Anesthesia Post Note  Patient: Stefanie Baldwin  Procedure(s) Performed: XI ROBOTIC ASSISTED TOTAL LAPAROSCOPIC HYSTERECTOMY AND SALPINGECTOMY (Bilateral ) XI ROBOTIC ASSISTED LAPAROSCOPIC LYSIS OF ADHESION     Patient location during evaluation: PACU Anesthesia Type: General Level of consciousness: awake and alert Pain management: pain level controlled Vital Signs Assessment: post-procedure vital signs reviewed and stable Respiratory status: spontaneous breathing, nonlabored ventilation and respiratory function stable Cardiovascular status: blood pressure returned to baseline and stable Postop Assessment: no apparent nausea or vomiting Anesthetic complications: no    Last Vitals:  Vitals:   10/06/18 1115 10/06/18 1145  BP: 139/87 (!) 142/82  Pulse: 95 97  Resp: 16 16  Temp: 37.2 C 37.1 C  SpO2: 98% 100%    Last Pain:  Vitals:   10/06/18 1115  TempSrc:   PainSc: [place]

## 2018-10-06 NOTE — Progress Notes (Signed)
10/06/2018 1940 Dr. Dellis Filbert called and made aware of pt. Continued c/o headache despite prn Ibuprofen and Percocet administration. VSS. Pt. Denies hx of migraine. States she does get h/a at home but takes "[employer]" for them. MAR reviewed with MD along with patient symptoms. Verbal order received ok to administer PRN Dilaudid 0.5 mg IV at this time. Orders enacted. Will continue to closely monitor patient.  Stefanie Baldwin, Arville Lime

## 2018-10-06 NOTE — Anesthesia Preprocedure Evaluation (Signed)
Anesthesia Evaluation  Patient identified by MRN, date of birth, ID band Patient awake    Reviewed: Allergy & Precautions, NPO status , Patient's Chart, lab work & pertinent test results  History of Anesthesia Complications (+) PONV  Airway Mallampati: II  TM Distance: >3 FB Neck ROM: Full    Dental  (+) Dental Advisory Given   Pulmonary neg pulmonary ROS,    Pulmonary exam normal breath sounds clear to auscultation       Cardiovascular negative cardio ROS Normal cardiovascular exam Rhythm:Regular Rate:Normal     Neuro/Psych negative neurological ROS  negative psych ROS   GI/Hepatic Neg liver ROS, GERD  Controlled and Medicated,  Endo/Other  Morbid obesity  Renal/GU negative Renal ROS  negative genitourinary   Musculoskeletal negative musculoskeletal ROS (+)   Abdominal (+) + obese,   Peds  Hematology negative hematology ROS (+) REFUSES BLOOD PRODUCTS,   Anesthesia Other Findings   Reproductive/Obstetrics Hx ectopic pregnancy                             Anesthesia Physical  Anesthesia Plan  ASA: III  Anesthesia Plan: General   Post-op Pain Management:    Induction: Intravenous  PONV Risk Score and Plan: 4 or greater and Treatment may vary due to age or medical condition, Ondansetron, Midazolam, Dexamethasone and Scopolamine patch - Pre-op  Airway Management Planned: Oral ETT  Additional Equipment: None  Intra-op Plan:   Post-operative Plan: Extubation in OR  Informed Consent: I have reviewed the patients History and Physical, chart, labs and discussed the procedure including the risks, benefits and alternatives for the proposed anesthesia with the patient or authorized representative who has indicated his/her understanding and acceptance.   Dental advisory given  Plan Discussed with: CRNA  Anesthesia Plan Comments:         Anesthesia Quick Evaluation

## 2018-10-06 NOTE — Anesthesia Procedure Notes (Signed)
Procedure Name: Intubation Date/Time: 10/06/2018 7:38 AM Performed by: Suan Halter, CRNA Pre-anesthesia Checklist: Patient identified, Emergency Drugs available, Suction available and Patient being monitored Patient Re-evaluated:Patient Re-evaluated prior to induction Oxygen Delivery Method: Circle system utilized Preoxygenation: Pre-oxygenation with 100% oxygen Induction Type: IV induction Ventilation: Mask ventilation without difficulty Laryngoscope Size: Mac and 3 Grade View: Grade I Tube type: Oral Tube size: 7.0 mm Number of attempts: 1 Airway Equipment and Method: Stylet and Oral airway Placement Confirmation: ETT inserted through vocal cords under direct vision,  positive ETCO2 and breath sounds checked- equal and bilateral Secured at: 22 cm Tube secured with: Tape Dental Injury: Teeth and Oropharynx as per pre-operative assessment

## 2018-10-06 NOTE — Op Note (Signed)
Operative Note  10/06/2018  9:47 AM  PATIENT:  Stefanie Baldwin  47 y.o. female  PRE-OPERATIVE DIAGNOSIS:  Chronic pelvic pain, fibroids, metrorrhagia  POST-OPERATIVE DIAGNOSIS:  Chronic pelvic pain, fibroids, metrorrhagia, bowel adhesions  PROCEDURE:  Procedure(s): XI ROBOTIC ASSISTED TOTAL LAPAROSCOPIC HYSTERECTOMY AND BILATERAL SALPINGECTOMY XI ROBOTIC ASSISTED LAPAROSCOPIC LYSIS OF ADHESIONS  SURGEON:  Surgeon(s): [name], MD Fontaine, Belinda Block, MD  ANESTHESIA:   general  FINDINGS: Uterus with many Fibroids.  Both Ovaries normal.  Bowel adhesions with left abdominal wall.  DESCRIPTION OF OPERATION: Under general anesthesia with endotracheal intubation the patient is in lithotomy position.  She is prepped with DuraPrep on the abdomen and with Betadine on the suprapubic, vulvar and vaginal areas.  She is draped as usual.  Timeout done.  The Foley is inserted in the bladder.  The vaginal exam reveals an anteverted uterus about 10 cm nodular mobile, no adnexal mass.  The weighted speculum is inserted in the vagina the anterior lip of the cervix is grasped with a tenaculum the hysterometry is at 8 cm.  We use a Rumi #8 with a medium Ko ring.  Those are put in place easily.  The other instruments are removed.      The supraumbilical area is infiltrated with Marcaine one quarter plain.  We make a 1.5 cm incision at that level.  The aponeurosis is grasped with Coker's.  The aponeurosis is opened with Mayo scissors.  The parietal peritoneum is opened bluntly with the finger.  A pursestring stitch of Vicryl 0 was done on the aponeurosis.  The Sheryle Hail is inserted under direct vision at that level.  Up pneumoperitoneum is created with CO2.  The camera was inserted at that level.  The anterior wall is clear at the level where the ports will be inserted.  The uterus presents many fibroids.  Both tubes are normal.  Both ovaries are normal in size and appearance.  The bowel is adherent to the  left lower abdominal wall.  The appendix appears normal.  Pictures were taken of all the structures.  Infiltration of Marcaine at all port sites and small incisions are made.  The patient is placed in deep Trendelenburg.  She tolerates it well.  3 robotic ports are inserted under direct vision, 2 on the right side and one on the lateral left side.  The 5 mm assistant port is inserted on the left medial side.  The camera port is docked.  Targeting is done.  The other robotic ports are docked.  The robotic instruments are inserted under direct vision with the fenestrated clamp in the fourth arm, the EndoShears scissor in the third arm, the PK in the first arm.  I go to the console.      Both ureters are seen in normal anatomic position with good peristalsis.  We start on the left side.  We cauterized and sectioned the left mesosalpinx.  We cauterized and sectioned the left utero-ovarian ligament.  We cauterized and sectioned the left round ligament.  We descend along the left broad ligament close to the uterus.  We opened the anterior visceral peritoneum and start descending the bladder.  We proceeded exactly the same way on the right side.  We further descended the bladder well past the [place].  The right uterine artery is cauterized.  Backflow is cauterized as well.  We then cauterized the left uterine artery and the backflow, we then cut the left uterine artery.  We came back to the  right side and sectioned the right uterine artery.  The vaginal occluder was inflated.  We opened the superior aspect of the vagina over the  ko-ring anteriorly and laterally and finished at the posterior aspect.  The uterus is completely detached and passed vaginally.  The uterus with both tubes and the cervix are sent to pathology.  The occluder is put back in place in the vagina.  We complete hemostasis with the PK at the vaginal vault.  We then switched instruments to the cutting mega-needle driver in the right hand and the long  tip in the left hand.  We closed the vaginal vault with a running suture of V lock 0 at 9 inches.  We started the right angle, finished at the left angle and came back on our steps.  Hemostasis is completed once more with the PK at the angles and superficially anteriorly.  Hemostasis is adequate at all levels.  Pictures are taken.  The needle was removed.  All robotic instruments were removed under direct vision.  The robot is undocked.  We go to laparoscopy time.      We confirmed good hemostasis by laparoscopy.  Remove the Trendelenburg.  There was no fluid to suction.  The laparoscopy instruments were removed.  The ports were removed under direct vision.  The CO2 was evacuated.  The pursestring stitch was attached at the supraumbilical incision.  All incisions were closed with separate stitches of Vicryl 4-0.  Dermabond was added on all incisions.  Hemostasis was good at all levels.  The occluder was removed from the vagina.  The patient was brought to recovery room in good and stable status.   ESTIMATED BLOOD LOSS: 30 mL   Intake/Output Summary (Last 24 hours) at 10/06/2018 0947 Last data filed at 10/06/2018 0930 Gross per 24 hour  Intake 1000 ml  Output 730 ml  Net 270 ml     BLOOD ADMINISTERED:none   LOCAL MEDICATIONS USED:  MARCAINE     SPECIMEN:  Source of Specimen:  Uterus with cervix, bilateral tubes  DISPOSITION OF SPECIMEN:  PATHOLOGY  COUNTS:  YES  PLAN OF CARE: Transfer to PACU  Marie-Lyne LavoieMD9:47 AM

## 2018-10-07 ENCOUNTER — Encounter (HOSPITAL_COMMUNITY): Payer: Self-pay | Admitting: Obstetrics & Gynecology

## 2018-10-07 DIAGNOSIS — G8929 Other chronic pain: Secondary | ICD-10-CM | POA: Diagnosis not present

## 2018-10-07 LAB — CBC
HCT: 39.9 % (ref 36.0–46.0)
Hemoglobin: 13 g/dL (ref 12.0–15.0)
MCH: 30 pg (ref 26.0–34.0)
MCHC: 32.6 g/dL (ref 30.0–36.0)
MCV: 91.9 fL (ref 80.0–100.0)
Platelets: 260 K/uL (ref 150–400)
RBC: 4.34 MIL/uL (ref 3.87–5.11)
RDW: 12.8 % (ref 11.5–15.5)
WBC: 10.8 K/uL — ABNORMAL HIGH (ref 4.0–10.5)
nRBC: 0 % (ref 0.0–0.2)

## 2018-10-07 MED ORDER — IBUPROFEN 200 MG PO TABS
ORAL_TABLET | ORAL | Status: AC
  Filled 2018-10-07: qty 1

## 2018-10-07 MED ORDER — IBUPROFEN 200 MG PO TABS
ORAL_TABLET | ORAL | Status: AC
  Filled 2018-10-07: qty 3

## 2018-10-07 MED ORDER — TRAMADOL HCL 50 MG PO TABS: 50.0000 mg | ORAL_TABLET | Freq: Four times a day (QID) | ORAL | 0 refills | Status: DC | PRN

## 2018-10-07 NOTE — Discharge Instructions (Signed)

## 2018-10-07 NOTE — Progress Notes (Signed)
POD#1 Robotic TLH/Bilateral Salpingectomy/Lysis of Adhesions  Subjective: Patient reports tolerating PO, + flatus and no problems voiding.    Objective: I have reviewed patient's vital signs.  vital signs, intake and output, medications and labs.  Vitals:   10/07/18 0136 10/07/18 0515  BP: 130/72 (!) 142/75  Pulse: 99 93  Resp: 18 17  Temp: 98.8 F (37.1 C) 98.3 F (36.8 C)  SpO2: 99% 97%   I/O last 3 completed shifts: In: 2785 [P.O.:560; I.V.:2125; IV Piggyback:100] Out: 2081 [Urine:3000; Emesis/NG output:400; Blood:30] No intake/output data recorded.  Results for orders placed or performed during the hospital encounter of 10/06/18 (from the past 24 hour(s))  CBC     Status: Abnormal   Collection Time: 10/07/18  5:14 AM  Result Value Ref Range   WBC 10.8 (H) 4.0 - 10.5 K/uL   RBC 4.34 3.87 - 5.11 MIL/uL   Hemoglobin 13.0 12.0 - 15.0 g/dL   HCT 39.9 36.0 - 46.0 %   MCV 91.9 80.0 - 100.0 fL   MCH 30.0 26.0 - 34.0 pg   MCHC 32.6 30.0 - 36.0 g/dL   RDW 12.8 11.5 - 15.5 %   Platelets 260 150 - 400 K/uL   nRBC 0.0 0.0 - 0.2 %    EXAM General: alert and cooperative Resp: clear to auscultation bilaterally Cardio: regular rate and rhythm GI: soft, non-tender; bowel sounds normal; no masses,  no organomegaly and incision: clean, dry and intact Extremities: no edema, redness or tenderness in the calves or thighs Vaginal Bleeding: none  Assessment: s/p Procedure(s): XI ROBOTIC ASSISTED TOTAL LAPAROSCOPIC HYSTERECTOMY AND SALPINGECTOMY XI ROBOTIC ASSISTED LAPAROSCOPIC LYSIS OF ADHESION: stable, progressing well and tolerating diet  Plan: Discharge home  LOS: 0 days   [name], MD 10/07/2018 8:02 AM

## 2018-10-20 ENCOUNTER — Encounter: Payer: Self-pay | Admitting: Anesthesiology

## 2018-10-27 ENCOUNTER — Other Ambulatory Visit: Payer: 59

## 2018-10-27 ENCOUNTER — Ambulatory Visit (INDEPENDENT_AMBULATORY_CARE_PROVIDER_SITE_OTHER): Payer: 59 | Admitting: Obstetrics & Gynecology

## 2018-10-27 ENCOUNTER — Encounter: Payer: Self-pay | Admitting: Obstetrics & Gynecology

## 2018-10-27 ENCOUNTER — Telehealth: Payer: Self-pay | Admitting: *Deleted

## 2018-10-27 VITALS — BP 126/82

## 2018-10-27 DIAGNOSIS — Z09 Encounter for follow-up examination after completed treatment for conditions other than malignant neoplasm: Secondary | ICD-10-CM

## 2018-10-27 DIAGNOSIS — R300 Dysuria: Secondary | ICD-10-CM

## 2018-10-27 MED ORDER — NITROFURANTOIN MONOHYD MACRO 100 MG PO CAPS: 100.0000 mg | ORAL_CAPSULE | Freq: Two times a day (BID) | ORAL | 0 refills | Status: AC

## 2018-10-27 NOTE — Telephone Encounter (Signed)
Patient was prescribed Macrobid at visit today states she will need 2 diflucan tablet to help prevent yeast after taking medication. Okay to send?

## 2018-10-27 NOTE — Telephone Encounter (Signed)
Yes, agree with Fluconazole 150 mg x 2 tab.

## 2018-10-27 NOTE — Patient Instructions (Signed)
1. Status post gynecological surgery, follow-up exam Very good postop evolution with no complication, except the possibility of having drained a very small seroma this morning.  No active bleeding currently and well closed vaginal vault.  The other possibility is that the fluid that the patient leaked this morning which was tinted red was coming from the bladder.  Precautions discussed in case patient has increasing pain, vaginal bleeding or fever, in that setting she would be reevaluated.  Otherwise follow-up in 4 weeks.  2. Dysuria Probable acute cystitis.  Will treat with nitrofurantoin.  Patient informed.  Prescription sent to pharmacy. - Urinalysis,Complete w/RFL Culture  Other orders - REFLEXIVE URINE CULTURE - nitrofurantoin, macrocrystal-monohydrate, (MACROBID) 100 MG capsule; Take 1 capsule (100 mg total) by mouth 2 (two) times daily for 7 days.  Stefanie Baldwin, it was a pleasure seeing you today!  I will inform you of your urine culture result as soon as it is available.

## 2018-10-27 NOTE — Progress Notes (Signed)
    Stefanie Baldwin May 03, 1971 [id number]        47 y.o.  G2P0010   RP: Postop 3 weeks Robotic TLH/Bilateral Salpingectomy/Lysis of Adhesions  HPI: Had 1 episode of red tinted fluid vaginally this morning.  Minimal spotting since.  No abdominal pelvic pain.  No fever.  Cloudy urine and suprapubic pressure.  No urinary frequency.  Bowel movements normal.   OB History  Gravida Para Term Preterm AB Living  2 1     1     SAB TAB Ectopic Multiple Live Births      1        # Outcome Date GA Lbr Len/2nd Weight Sex Delivery Anes PTL Lv  2 Para           1 Ectopic             Past medical history,surgical history, problem list, medications, allergies, family history and social history were all reviewed and documented in the EPIC chart.   Directed ROS with pertinent positives and negatives documented in the history of present illness/assessment and plan.  Exam:  Vitals:   10/27/18 1136  BP: 126/82   General appearance:  Normal  Abdomen: Norml, soft, not distended.  Incisions closed, no erythema.  Gynecologic exam: Vulva normal.  Speculum:  Vaginal vault well closed.  No active bleeding.  U/A: Dark yellow cloudy, protein 1+, nitrites negative, white blood cells 40-60, red blood cells more than 60, many bacteria.  Urine culture pending.   Assessment/Plan:  47 y.o. G2P0010   1. Status post gynecological surgery, follow-up exam Very good postop evolution with no complication, except the possibility of having drained a very small seroma this morning.  No active bleeding currently and well closed vaginal vault.  The other possibility is that the fluid that the patient leaked this morning which was tinted red was coming from the bladder.  Precautions discussed in case patient has increasing pain, vaginal bleeding or fever, in that setting she would be reevaluated.  Otherwise follow-up in 4 weeks.  2. Dysuria Probable acute cystitis.  Will treat with nitrofurantoin.  Patient informed.   Prescription sent to pharmacy. - Urinalysis,Complete w/RFL Culture  Other orders - REFLEXIVE URINE CULTURE - nitrofurantoin, macrocrystal-monohydrate, (MACROBID) 100 MG capsule; Take 1 capsule (100 mg total) by mouth 2 (two) times daily for 7 days.  Counseling on above issues and coordination of care more than 50% for 15 minutes.  [name] MD, 12:09 PM 10/27/2018

## 2018-10-28 MED ORDER — FLUCONAZOLE 150 MG PO TABS: ORAL_TABLET | ORAL | 0 refills | Status: DC

## 2018-10-28 NOTE — Telephone Encounter (Signed)
Left on voicemail Rx sent 

## 2018-10-29 LAB — URINALYSIS, COMPLETE W/RFL CULTURE
Bilirubin Urine: NEGATIVE
Glucose, UA: NEGATIVE
HYALINE CAST: NONE SEEN /LPF
KETONES UR: NEGATIVE
Nitrites, Initial: NEGATIVE
Specific Gravity, Urine: 1.015 (ref 1.001–1.03)
pH: 6.5 (ref 5.0–8.0)

## 2018-10-29 LAB — URINE CULTURE
MICRO NUMBER:: 91366812
SPECIMEN QUALITY: ADEQUATE

## 2018-10-29 LAB — CULTURE INDICATED

## 2018-11-10 ENCOUNTER — Telehealth: Payer: Self-pay | Admitting: *Deleted

## 2018-11-10 NOTE — Telephone Encounter (Signed)
Yes, light spotting normal up to 12 wks after Robotic TLH.  12 wks is the time it takes for the stitches used at the vaginal cuff to resorb.

## 2018-11-10 NOTE — Telephone Encounter (Signed)
Patient informed. 

## 2018-11-10 NOTE — Telephone Encounter (Signed)
Patient called c/o daily spotting x 2 weeks post-Robotic TLH/Bilateral Salpingectomy/Lysis of Adhesions, bleeding is not heavy,varies to spotting to light bleeding.  Had post op appointment on 10/27/18 told to follow up in 4 weeks. Patient wanted to know if this can be normal? Please advise

## 2018-11-24 ENCOUNTER — Encounter: Payer: Self-pay | Admitting: Obstetrics & Gynecology

## 2018-11-24 ENCOUNTER — Ambulatory Visit (INDEPENDENT_AMBULATORY_CARE_PROVIDER_SITE_OTHER): Payer: 59 | Admitting: Obstetrics & Gynecology

## 2018-11-24 VITALS — BP 128/86

## 2018-11-24 DIAGNOSIS — Z09 Encounter for follow-up examination after completed treatment for conditions other than malignant neoplasm: Secondary | ICD-10-CM

## 2018-11-24 NOTE — Patient Instructions (Signed)
1. Status post gynecological surgery, follow-up exam Good postop evolution after X I robotic TLH/bilateral salpingectomy/lysis of adhesions.  Can resume all physical activities.  Will resume sexual activities in a week.  Follow-up annual gynecologic exam in a year.  Screening mammogram in April 2020.  Stefanie Baldwin, it was a pleasure seeing you today!

## 2018-11-24 NOTE — Progress Notes (Signed)
    Stefanie Baldwin [birth year] [id number]        47 y.o.  G2P0010   RP: Postop 7 weeks XI Robotic TLH/Bilateral Salpingectomy  HPI: S/P XI Robotic TLH/Bilateral Salpingectomy/Lysis of Adhesions.  Mild brown spotting vaginally.  No abdo-pelvic pain. Urine/BMs wnl.  No fever.   OB History  Gravida Para Term Preterm AB Living  2 1     1     SAB TAB Ectopic Multiple Live Births      1        # Outcome Date GA Lbr Len/2nd Weight Sex Delivery Anes PTL Lv  2 Para           1 Ectopic             Past medical history,surgical history, problem list, medications, allergies, family history and social history were all reviewed and documented in the EPIC chart.   Directed ROS with pertinent positives and negatives documented in the history of present illness/assessment and plan.  Exam:  Vitals:   11/24/18 1515  BP: 128/86   General appearance:  Normal  Abdomen: Incisions well healed/closed.  Suture trimmed at Rt distal incision.  Gynecologic exam: Vulva normal.  Speculum:  Vaginal vault well healed.  Brownish discharge.  Bimanual exam:  Vaginal vault well healed, no induration, NT.  No pelvic mass felt/NT.   Assessment/Plan:  47 y.o. G2P0010   1. Status post gynecological surgery, follow-up exam Good postop evolution after X I robotic TLH/bilateral salpingectomy/lysis of adhesions.  Can resume all physical activities.  Will resume sexual activities in a week.  Follow-up annual gynecologic exam in a year.  Screening mammogram in April 2020.  [name] MD, 3:33 PM 11/24/2018

## 2019-02-09 ENCOUNTER — Encounter: Payer: Self-pay | Admitting: Obstetrics & Gynecology

## 2019-02-09 ENCOUNTER — Ambulatory Visit (INDEPENDENT_AMBULATORY_CARE_PROVIDER_SITE_OTHER): Payer: 59 | Admitting: Obstetrics & Gynecology

## 2019-02-09 VITALS — BP 122/90 | Wt 255.0 lb

## 2019-02-09 DIAGNOSIS — Z90710 Acquired absence of both cervix and uterus: Secondary | ICD-10-CM

## 2019-02-09 DIAGNOSIS — N3090 Cystitis, unspecified without hematuria: Secondary | ICD-10-CM | POA: Diagnosis not present

## 2019-02-09 NOTE — Progress Notes (Signed)
    Stefanie Baldwin Jan 31, 1971 [id number]        48 y.o.  G2P0010 Married  RP: Suprapubic pain with blood in urine  HPI: Status post XI Robotic TLH with bilateral salpingectomy/lysis of adhesions October 06, 2018.  Had urinary frequency with dysuria and blood in urine.  Had a urine analysis and pending urine culture currently.  On ciprofloxacin and doxycycline for 10 days.  Improved symptoms on antibiotics.  Came here today to make sure that her symptoms were not gynecologic.  No pelvic pain or vaginal bleeding.  Normal bowel movements.  No fever.    OB History  Gravida Para Term Preterm AB Living  2 1     1     SAB TAB Ectopic Multiple Live Births      1        # Outcome Date GA Lbr Len/2nd Weight Sex Delivery Anes PTL Lv  2 Para           1 Ectopic             Past medical history,surgical history, problem list, medications, allergies, family history and social history were all reviewed and documented in the EPIC chart.   Directed ROS with pertinent positives and negatives documented in the history of present illness/assessment and plan.  Exam:  Vitals:   02/09/19 1607  BP: 122/90  Weight: 255 lb (115.7 kg)   General appearance:  Normal  Abdomen: Normal, soft and not distended.  Incisions from surgery in October 2019 completely healed.  CVAT neg bilaterally  Gynecologic exam: Vulva normal.  Speculum: Vaginal vault very well-healed.  Normal vaginal mucosa.  Normal vaginal secretions.  No bleeding.  Bimanual exam: No pelvic mass felt and no tenderness.   Assessment/Plan:  48 y.o. G2P0010   1. Cystitis Cystitis responding to antibiotic treatment with ciprofloxacin and doxycycline.  Recommend finishing the antibiotic treatment and verifying the urine culture when available.  2. S/P total hysterectomy Well-healed from robotic TLH/bilateral salpingectomy/lysis of adhesion in October 2019.  No active gynecologic condition currently.  Patient reassured.  Counseling on above  issues and coordination of care more than 50% for 15 minutes.  [name] MD, 4:18 PM 02/09/2019

## 2019-02-12 ENCOUNTER — Encounter: Payer: Self-pay | Admitting: Obstetrics & Gynecology

## 2019-02-12 NOTE — Patient Instructions (Signed)
1. Cystitis Cystitis responding to antibiotic treatment with ciprofloxacin and doxycycline.  Recommend finishing the antibiotic treatment and verifying the urine culture when available.  2. S/P total hysterectomy Well-healed from robotic TLH/bilateral salpingectomy/lysis of adhesion in October 2019.  No active gynecologic condition currently.  Patient reassured.  Donell, it was a pleasure seeing you today!

## 2019-03-10 ENCOUNTER — Telehealth: Payer: Self-pay | Admitting: *Deleted

## 2019-03-10 MED ORDER — CIPROFLOXACIN HCL 500 MG PO TABS: 500.0000 mg | ORAL_TABLET | Freq: Two times a day (BID) | ORAL | 0 refills | Status: DC

## 2019-03-10 NOTE — Telephone Encounter (Signed)
Patient was seen on 02/09/19 prescribed ciprofloxacin 500 mg tablet and doxycycline for cystitis. Patient called today c/o blood in urine and frequent urination, burning with urination as well. She did well with cipro asked if she could have this Rx again? She is not having any vaginal bleeding. Please advise

## 2019-03-10 NOTE — Telephone Encounter (Signed)
I spoke with Dr.Lavoie regarding the below note and was told okay for patient to have same Rx and dose as last month on 02/09/19. Patient said another MD prescribed which Dr.Lavoie is aware based on note 02/09/19. I called and spoke with patient was told the Cipro 500 mg was BID x 7 days. I called [facility] to confirm and was told yes. Rx sent pt aware.

## 2019-06-14 ENCOUNTER — Other Ambulatory Visit: Payer: Self-pay | Admitting: Obstetrics & Gynecology

## 2019-06-14 ENCOUNTER — Telehealth: Payer: Self-pay | Admitting: *Deleted

## 2019-06-14 DIAGNOSIS — Z1231 Encounter for screening mammogram for malignant neoplasm of breast: Secondary | ICD-10-CM

## 2019-06-14 NOTE — Telephone Encounter (Signed)
Patient called c/o breast tenderness called breast center they told her to call our office to get diag. Imaging. I informed if breast problems needs OV for breast exam, transferred to appointment desk to schedule.

## 2019-06-17 ENCOUNTER — Ambulatory Visit: Payer: 59

## 2019-06-21 ENCOUNTER — Other Ambulatory Visit: Payer: Self-pay | Admitting: *Deleted

## 2019-06-21 DIAGNOSIS — N6310 Unspecified lump in the right breast, unspecified quadrant: Secondary | ICD-10-CM

## 2019-06-25 ENCOUNTER — Other Ambulatory Visit: Payer: Self-pay

## 2019-06-25 ENCOUNTER — Ambulatory Visit
Admission: RE | Admit: 2019-06-25 | Discharge: 2019-06-25 | Disposition: A | Discharge status: Home / Self Care | Payer: 59 | Source: Ambulatory Visit | Attending: *Deleted | Admitting: *Deleted

## 2019-06-25 DIAGNOSIS — N6310 Unspecified lump in the right breast, unspecified quadrant: Secondary | ICD-10-CM

## 2019-11-29 ENCOUNTER — Other Ambulatory Visit: Payer: Self-pay

## 2019-11-29 ENCOUNTER — Ambulatory Visit (INDEPENDENT_AMBULATORY_CARE_PROVIDER_SITE_OTHER): Payer: Managed Care, Other (non HMO) | Admitting: Women's Health

## 2019-11-29 ENCOUNTER — Encounter: Payer: Self-pay | Admitting: Women's Health

## 2019-11-29 VITALS — BP 120/80

## 2019-11-29 DIAGNOSIS — B373 Candidiasis of vulva and vagina: Secondary | ICD-10-CM | POA: Diagnosis not present

## 2019-11-29 DIAGNOSIS — B3731 Acute candidiasis of vulva and vagina: Secondary | ICD-10-CM

## 2019-11-29 DIAGNOSIS — R102 Pelvic and perineal pain unspecified side: Secondary | ICD-10-CM

## 2019-11-29 DIAGNOSIS — N3001 Acute cystitis with hematuria: Secondary | ICD-10-CM | POA: Diagnosis not present

## 2019-11-29 LAB — URINALYSIS, COMPLETE W/RFL CULTURE
Bilirubin Urine: NEGATIVE
Glucose, UA: NEGATIVE
Nitrites, Initial: NEGATIVE
pH: 5.5 (ref 5.0–8.0)

## 2019-11-29 MED ORDER — SULFAMETHOXAZOLE-TRIMETHOPRIM 800-160 MG PO TABS: 1.0000 | ORAL_TABLET | Freq: Two times a day (BID) | ORAL | 0 refills | Status: DC

## 2019-11-29 MED ORDER — FLUCONAZOLE 150 MG PO TABS: ORAL_TABLET | ORAL | 0 refills | Status: DC

## 2019-11-29 NOTE — Patient Instructions (Signed)

## 2019-11-29 NOTE — Progress Notes (Signed)
48 year old MBF G2, P0 presents with complaint of increased urinary frequency, urgency and blood in the urine symptoms increased today but started 2 days ago.  Denies back/abdominal pain, fever, vaginal discharge, irritation or odor.  Denies any change in routine.  States has had a UTI in the past with similar symptoms.  TVH for endometriosis on no HRT.  Medical problems include asthma.  Exam: Appears well, obese, declined vaginal pelvic exam. UA: +2 blood, +1 leukocytes, 40-60 WBCs, 10-20 RBCs, 10-20 squamous epithelials, many bacteria  UTI  Plan: Urine culture pending.  Septra twice daily for 3 days, UTI prevention discussed.  Instructed to call if no relief of symptoms.  Diflucan 150 mg. 2 tablets given for request, states often gets a yeast infection after antibiotics.

## 2019-12-01 LAB — URINALYSIS, COMPLETE W/RFL CULTURE
Hyaline Cast: NONE SEEN /LPF
Ketones, ur: NEGATIVE
Specific Gravity, Urine: 1.022 (ref 1.001–1.03)

## 2019-12-01 LAB — URINE CULTURE
MICRO NUMBER:: 1194614
SPECIMEN QUALITY:: ADEQUATE

## 2019-12-01 LAB — CULTURE INDICATED

## 2019-12-02 ENCOUNTER — Telehealth: Payer: Self-pay | Admitting: *Deleted

## 2019-12-02 NOTE — Telephone Encounter (Signed)
Patient had office visit on 12/14/120 with Stefanie Baldwin for UTI, completed Bactrim DS 1 po BID x 3 days. Patient said she is till having pressure and urgency. Patient asked if another Rx could be sent? Please advise

## 2019-12-02 NOTE — Telephone Encounter (Signed)
Urine culture positive for Klebsiella Pneumoniae.  Sensitive to Cipro.  Please send Ciprofloxacin 500 mg BID x 7 days.   #14

## 2019-12-03 MED ORDER — CIPROFLOXACIN HCL 500 MG PO TABS: 500.0000 mg | ORAL_TABLET | Freq: Two times a day (BID) | ORAL | 0 refills | Status: DC

## 2019-12-03 NOTE — Telephone Encounter (Signed)
Left detailed message on cell Rx has been sent. 

## 2020-01-18 ENCOUNTER — Encounter: Payer: Self-pay | Admitting: Neurology

## 2020-01-18 ENCOUNTER — Other Ambulatory Visit: Payer: Self-pay

## 2020-01-18 ENCOUNTER — Ambulatory Visit (INDEPENDENT_AMBULATORY_CARE_PROVIDER_SITE_OTHER): Payer: Managed Care, Other (non HMO) | Admitting: Neurology

## 2020-01-18 VITALS — BP 128/82 | HR 100 | Temp 96.9°F | Ht 65.0 in | Wt 245.5 lb

## 2020-01-18 DIAGNOSIS — G43109 Migraine with aura, not intractable, without status migrainosus: Secondary | ICD-10-CM | POA: Diagnosis not present

## 2020-01-18 MED ORDER — RIZATRIPTAN BENZOATE 5 MG PO TBDP: 5.0000 mg | ORAL_TABLET | ORAL | 12 refills | Status: DC | PRN

## 2020-01-18 MED ORDER — TOPIRAMATE 100 MG PO TABS: 100.0000 mg | ORAL_TABLET | Freq: Two times a day (BID) | ORAL | 11 refills | Status: AC

## 2020-01-18 NOTE — Progress Notes (Signed)
PATIENT: Stefanie Baldwin DOB: 1971/03/04  Chief Complaint  Patient presents with  . Migraine    Reports daily headaches in varying degrees of pain. She uses NSAIDS when they are severe which lessens the discomfort but does not resolve the headache completely.  Marland Kitchen PCP    Everardo Beals, NP     HISTORICAL  Stefanie Baldwin is a 49 year old female, seen in request by her primary care nurse practitioner Everardo Beals for evaluation of chronic migraine headaches, initial evaluation was on January 18, 2020.  I have reviewed and summarized the referring note from the referring physician.  She had a history of allergy, reported long history of migraine headaches as long as she can remember, gradually getting worse over her adult life, she has constant mild to moderate bilateral frontal pressure headache on a daily basis, couple times each month, it would exacerbated to a much severe headaches, doing intense headache, she would have light noise sensitivity, dizzy, difficulty focusing, lasting for few hours to few days  Her headache used to responding well to over-the-counter ibuprofen, now she has to rely on more over-the-counter medications, 3-4 times each week, she will take ibuprofen 800 mg, then followed by [facility] powder, to try to get rid of the headache, she complains of significant GI side effect with medications  She has never tried triptan treatment  MRI of the brain with without contrast from [facility] in January 2021 was normal  Laboratory evaluations in January 2021, normal CMP, CBC,  REVIEW OF SYSTEMS: Full 14 system review of systems performed and notable only for as above  All other review of systems were negative.  ALLERGIES: Allergies  Allergen Reactions  . Other     Pt does not want to receive any BLOOD     HOME MEDICATIONS: Current Outpatient Medications  Medication Sig Dispense Refill  . albuterol (PROAIR HFA) 108 (90 Base) MCG/ACT inhaler Inhale 2 puffs into  the lungs every 6 (six) hours as needed for wheezing or shortness of breath.     . betamethasone valerate (VALISONE) 0.1 % cream Apply 1 application topically daily as needed (eczema).   6  . cetirizine (ZYRTEC) 10 MG tablet Take 10 mg by mouth daily.    Marland Kitchen ibuprofen (ADVIL,MOTRIN) 200 MG tablet Take 600-800 mg by mouth every 8 (eight) hours as needed for mild pain or moderate pain.    . Vitamin D, Ergocalciferol, (DRISDOL) 50000 UNITS CAPS capsule Take 50,000 Units by mouth every 7 (seven) days. Sunday  3   No current facility-administered medications for this visit.    PAST MEDICAL HISTORY: Past Medical History:  Diagnosis Date  . Complication of anesthesia   . Ectopic pregnancy   . Fibroids    uterine   . GERD (gastroesophageal reflux disease)    uses OTC meds prn  . Headache   . Mass    on neck and chest  . Obesity   . Pneumonia 2 years ago   . PONV (postoperative nausea and vomiting)    "severe" PONV ; does well with "patch and IV antinausea"     PAST SURGICAL HISTORY: Past Surgical History:  Procedure Laterality Date  . CYST REMOVAL HAND     wrist  . EXCISION MASS NECK N/A 12/01/2017   Procedure: EXCISION MASS POSTERIOR NECK;  Surgeon: Cristine Polio, MD;  Location: [place];  Service: Plastics;  Laterality: N/A;  . FOOT SURGERY    . LIPOSUCTION N/A 12/01/2017  Procedure: LIPOSUCTION;  Surgeon: Cristine Polio, MD;  Location: [place];  Service: Plastics;  Laterality: N/A;  . OVARIAN CYST REMOVAL    . REDUCTION MAMMAPLASTY Bilateral   . ROBOTIC ASSISTED LAPAROSCOPIC HYSTERECTOMY AND SALPINGECTOMY Bilateral 10/06/2018   Procedure: XI ROBOTIC ASSISTED TOTAL LAPAROSCOPIC HYSTERECTOMY AND SALPINGECTOMY;  Surgeon: [name], MD;  Location: [facility];  Service: Gynecology;  Laterality: Bilateral;  . ROBOTIC ASSISTED LAPAROSCOPIC LYSIS OF ADHESION  10/06/2018   Procedure: XI ROBOTIC ASSISTED LAPAROSCOPIC LYSIS OF ADHESION;   Surgeon: [name], MD;  Location: [facility];  Service: Gynecology;;    FAMILY HISTORY: Family History  Problem Relation Age of Onset  . Thyroid disease Mother   . Diabetes Mother   . Hypertension Mother   . Heart disease Mother   . Diabetes Father   . Thyroid disease Brother   . Colon cancer Neg Hx   . Stomach cancer Neg Hx   . Rectal cancer Neg Hx   . Esophageal cancer Neg Hx   . Liver cancer Neg Hx   . Breast cancer Neg Hx     SOCIAL HISTORY: Social History   Socioeconomic History  . Marital status: Married    Spouse name: Not on file  . Number of children: 0  . Years of education: college  . Highest education level: Not on file  Occupational History  . Occupation: Buyer, retail  Tobacco Use  . Smoking status: Never Smoker  . Smokeless tobacco: Never Used  Substance and Sexual Activity  . Alcohol use: No    Alcohol/week: 0.0 standard drinks  . Drug use: No  . Sexual activity: Yes    Partners: Male    Birth control/protection: None    Comment: patient's husband with vasectomy  Other Topics Concern  . Not on file  Social History Narrative   Lives at home with husband.   Right-handed.   One cup caffeine per day.   Social Determinants of Health   Financial Resource Strain:   . Difficulty of Paying Living Expenses: Not on file  Food Insecurity:   . Worried About Charity fundraiser in the Last Year: Not on file  . Ran Out of Food in the Last Year: Not on file  Transportation Needs:   . Lack of Transportation (Medical): Not on file  . Lack of Transportation (Non-Medical): Not on file  Physical Activity:   . Days of Exercise per Week: Not on file  . Minutes of Exercise per Session: Not on file  Stress:   . Feeling of Stress : Not on file  Social Connections:   . Frequency of Communication with Friends and Family: Not on file  . Frequency of Social Gatherings with Friends and Family: Not on file  . Attends Religious Services: Not on file  . Active  Member of Clubs or Organizations: Not on file  . Attends Archivist Meetings: Not on file  . Marital Status: Not on file  Intimate Partner Violence:   . Fear of Current or Ex-Partner: Not on file  . Emotionally Abused: Not on file  . Physically Abused: Not on file  . Sexually Abused: Not on file     PHYSICAL EXAM   Vitals:   01/18/20 1120  BP: 128/82  Pulse: 100  Temp: (!) 96.9 F (36.1 C)  Weight: 245 lb 8 oz (111.4 kg)  Height: 5\' 5"  (1.651 m)    Not recorded      Body mass index is  40.85 kg/m.  PHYSICAL EXAMNIATION:  Gen: NAD, conversant, well nourised, well groomed                     Cardiovascular: Regular rate rhythm, no peripheral edema, warm, nontender. Eyes: Conjunctivae clear without exudates or hemorrhage Neck: Supple, no carotid bruits. Pulmonary: Clear to auscultation bilaterally   NEUROLOGICAL EXAM:  MENTAL STATUS: Speech:    Speech is normal; fluent and spontaneous with normal comprehension.  Cognition:     Orientation to time, place and person     Normal recent and remote memory     Normal Attention span and concentration     Normal Language, naming, repeating,spontaneous speech     Fund of knowledge   CRANIAL NERVES: CN II: Visual fields are full to confrontation. Pupils are round equal and briskly reactive to light. CN III, IV, VI: extraocular movement are normal. No ptosis. CN V: Facial sensation is intact to light touch CN VII: Face is symmetric with normal eye closure  CN VIII: Hearing is normal to causal conversation. CN IX, X: Phonation is normal. CN XI: Head turning and shoulder shrug are intact  MOTOR: There is no pronator drift of out-stretched arms. Muscle bulk and tone are normal. Muscle strength is normal.  REFLEXES: Reflexes are 2+ and symmetric at the biceps, triceps, knees, and ankles. Plantar responses are flexor.  SENSORY: Intact to light touch, pinprick and vibratory sensation are intact in fingers and  toes.  COORDINATION: There is no trunk or limb dysmetria noted.  GAIT/STANCE: Posture is normal. Gait is steady with normal steps, base, arm swing, and turning. Heel and toe walking are normal. Tandem gait is normal.  Romberg is absent.   DIAGNOSTIC DATA (LABS, IMAGING, TESTING) - I reviewed patient records, labs, notes, testing and imaging myself where available.   ASSESSMENT AND PLAN  Stefanie Baldwin is a 49 y.o. female    Chronic migraine with aura  Start preventive medication Topamax 100 mg titrating to twice a day  Maxalt 5 mg as needed.  Return to clinic with Judson Roch in 3 months  Marcial Pacas, M.D. Ph.D.  [facility] [address], [place], [place] [address] Ph: [phone] Fax: [phone]  CC: Everardo Beals, NP

## 2020-01-24 ENCOUNTER — Telehealth: Payer: Self-pay | Admitting: Neurology

## 2020-01-24 NOTE — Telephone Encounter (Addendum)
I returned the call to the patient. At her last appt, topiramate and rizatriptan were sent to the pharmacy for her. I spoke to the patient and she started topiramate 100mg  BID on 01/19/2020. She has been feeling dizzy and swimmy headed so she reduced her topiramate dose to 100mg  at bedtime on 01/22/20. She has been feeling some better. She was instructed to slowly titrate the topiramate back up, as tolerated, by taking 100mg  at bedtime until she feels better then adding 0.5 tab in morning then eventually getting up to 100mg , BID. She verbalized understanding.  She also felt the rizatriptan made the dizziness and swimmy headedness worse but it is hard to know since she started the topiramate at the same time. She tried it three times but would like to try it again.  Says the flashes of light she has with her migraines are more bothersome than pain. She is hoping topiramate will help. She will keep [place] informed on her progress and call us back with any concerns.

## 2020-01-24 NOTE — Telephone Encounter (Signed)
Pt called stating that she would like to know if the two medications that were mentioned to her in her last appt can be called in for her Migraines. Please advise.

## 2020-02-26 ENCOUNTER — Ambulatory Visit: Payer: 59 | Attending: Internal Medicine

## 2020-02-26 DIAGNOSIS — Z23 Encounter for immunization: Secondary | ICD-10-CM

## 2020-02-26 NOTE — Progress Notes (Signed)
   Covid-19 Vaccination Clinic  Name:  Stefanie Baldwin    MRN: [id number] DOB: 1971/06/27  02/26/2020  [name] was observed post Covid-19 immunization for 15 minutes without incident. She was provided with Vaccine Information Sheet and instruction to access the V-Safe system.   [name] was instructed to call 911 with any severe reactions post vaccine: Marland Kitchen Difficulty breathing  . Swelling of face and throat  . A fast heartbeat  . A bad rash all over body  . Dizziness and weakness   Immunizations Administered    Name Date Dose VIS Date Route   Pfizer COVID-19 Vaccine 02/26/2020  8:27 AM 0.3 mL 11/26/2019 Intramuscular   Manufacturer: [place]   Lot: [id number]   [place]: [id number]

## 2020-02-29 ENCOUNTER — Telehealth: Payer: Self-pay | Admitting: Neurology

## 2020-02-29 NOTE — Telephone Encounter (Signed)
Phentermine-topiramate--In 2012, the [place] Food and Drug Administration (FDA) approved a preparation of phentermine and extended-release topiramate (in one capsule) for adults with a body mass index (BMI) ?30 kg/m2 or with a BMI ?27 kg/m2 with at least one weight-related comorbidity (eg, hypertension, diabetes, dyslipidemia) [56]. We do not recommend phentermine-topiramate for patients with cardiovascular disease (hypertension or coronary heart disease). Phentermine-topiramate may be considered for individuals with obesity who do not have cardiovascular disease, particularly those who do not tolerate orlistat or liraglutide. The efficacy and safety of combining generic phentermine with generic topiramate for weight loss (each taken individually) has not yet been established   It is safe to take phentermine and Topamax together, please advise patient to follow-up with her primary care physician regularly to monitor her weight loss

## 2020-02-29 NOTE — Telephone Encounter (Signed)
Left message requesting a return call.

## 2020-02-29 NOTE — Telephone Encounter (Signed)
Called pt. Relayed Dr. Rhea Belton message that it is safe for her to take phentermine and topamax together. She will f/u with PCP regularly to monitor weight loss. Nothing further needed.

## 2020-02-29 NOTE — Telephone Encounter (Signed)
Pt called back in regards to missed call  

## 2020-02-29 NOTE — Telephone Encounter (Signed)
Pt called wanting to know if she can take phentermine with topiramate (TOPAMAX) 100 MG tablet

## 2020-02-29 NOTE — Telephone Encounter (Signed)
She confirmed her dosage of topiramate 100mg , one tablet BID. She was previously on phentermine 37.5mg , one tablet daily for weight loss and would like to restart the medication. She wants to know if they are safe to take together. She has not had any weight loss while being on just topiramate.

## 2020-03-20 ENCOUNTER — Ambulatory Visit: Payer: 59 | Attending: Internal Medicine

## 2020-03-20 ENCOUNTER — Ambulatory Visit: Payer: 59

## 2020-03-20 DIAGNOSIS — Z23 Encounter for immunization: Secondary | ICD-10-CM

## 2020-03-20 NOTE — Progress Notes (Signed)
   Covid-19 Vaccination Clinic  Name:  Stefanie Baldwin    MRN: [id number] DOB: Jan 22, 1971  03/20/2020  [name] was observed post Covid-19 immunization for 15 minutes without incident. She was provided with Vaccine Information Sheet and instruction to access the V-Safe system.   [name] was instructed to call 911 with any severe reactions post vaccine: Marland Kitchen Difficulty breathing  . Swelling of face and throat  . A fast heartbeat  . A bad rash all over body  . Dizziness and weakness   Immunizations Administered    Name Date Dose VIS Date Route   Pfizer COVID-19 Vaccine 03/20/2020 10:31 AM 0.3 mL 11/26/2019 Intramuscular   Manufacturer: [place]   Lot: [id number]   [place]: [id number]

## 2020-03-22 ENCOUNTER — Ambulatory Visit: Payer: 59

## 2020-04-17 NOTE — Progress Notes (Signed)
PATIENT: Stefanie Baldwin DOB: Jan 24, 1971  REASON FOR VISIT: follow up HISTORY FROM: patient  HISTORY OF PRESENT ILLNESS: Today 04/18/20  HISTORY  Stefanie Baldwin is a 49 year old female, seen in request by her primary care nurse practitioner Everardo Beals for evaluation of chronic migraine headaches, initial evaluation was on January 18, 2020.  I have reviewed and summarized the referring note from the referring physician.  She had a history of allergy, reported long history of migraine headaches as long as she can remember, gradually getting worse over her adult life, she has constant mild to moderate bilateral frontal pressure headache on a daily basis, couple times each month, it would exacerbated to a much severe headaches, doing intense headache, she would have light noise sensitivity, dizzy, difficulty focusing, lasting for few hours to few days  Her headache used to responding well to over-the-counter ibuprofen, now she has to rely on more over-the-counter medications, 3-4 times each week, she will take ibuprofen 800 mg, then followed by [facility] powder, to try to get rid of the headache, she complains of significant GI side effect with medications  She has never tried triptan treatment  MRI of the brain with without contrast from [facility] in January 2021 was normal  Laboratory evaluations in January 2021, normal CMP, CBC,  Update Apr 18, 2020 SS: When last seen, was started on Topamax titrating to 100 mg twice a day, Maxalt as needed.  With Topamax, no longer severe headache, less aura of flashing lights, 60 to 70% improvement, but does experience "brain fog".  Still has some headache, but is difficult to quantify, and no longer having 3-4 severe migraines weekly.  Describes aura as flashlight shining over eyes, camera flash.  Took Maxalt once, early on with Topamax, felt dizzy, but thought related to side effect from all new medications.  Overall, she is pleased with Topamax,  as aura was so significant, she thought she was not going to be able to work, works as an Web designer for [employer]. Reports intermittent blurry vision when reading, has seen eye doctor twice, has readers.   REVIEW OF SYSTEMS: Out of a complete 14 system review of symptoms, the patient complains only of the following symptoms, and all other reviewed systems are negative.  Headache   ALLERGIES: Allergies  Allergen Reactions  . Other     Pt does not want to receive any BLOOD     HOME MEDICATIONS: Outpatient Medications Prior to Visit  Medication Sig Dispense Refill  . albuterol (PROAIR HFA) 108 (90 Base) MCG/ACT inhaler Inhale 2 puffs into the lungs every 6 (six) hours as needed for wheezing or shortness of breath.     . betamethasone valerate (VALISONE) 0.1 % cream Apply 1 application topically daily as needed (eczema).   6  . cetirizine (ZYRTEC) 10 MG tablet Take 10 mg by mouth daily.    Marland Kitchen ibuprofen (ADVIL,MOTRIN) 200 MG tablet Take 600-800 mg by mouth every 8 (eight) hours as needed for mild pain or moderate pain.    . rizatriptan (MAXALT-MLT) 5 MG disintegrating tablet Take 1 tablet (5 mg total) by mouth as needed. May repeat in 2 hours if needed 15 tablet 12  . topiramate (TOPAMAX) 100 MG tablet Take 1 tablet (100 mg total) by mouth 2 (two) times daily. 60 tablet 11  . Vitamin D, Ergocalciferol, (DRISDOL) 50000 UNITS CAPS capsule Take 50,000 Units by mouth every 7 (seven) days. Sunday  3   No facility-administered medications prior to  visit.    PAST MEDICAL HISTORY: Past Medical History:  Diagnosis Date  . Complication of anesthesia   . Ectopic pregnancy   . Fibroids    uterine   . GERD (gastroesophageal reflux disease)    uses OTC meds prn  . Headache   . Mass    on neck and chest  . Obesity   . Pneumonia 2 years ago   . PONV (postoperative nausea and vomiting)    "severe" PONV ; does well with "patch and IV antinausea"     PAST SURGICAL HISTORY: Past  Surgical History:  Procedure Laterality Date  . CYST REMOVAL HAND     wrist  . EXCISION MASS NECK N/A 12/01/2017   Procedure: EXCISION MASS POSTERIOR NECK;  Surgeon: Cristine Polio, MD;  Location: [place];  Service: Plastics;  Laterality: N/A;  . FOOT SURGERY    . LIPOSUCTION N/A 12/01/2017   Procedure: LIPOSUCTION;  Surgeon: Cristine Polio, MD;  Location: [place];  Service: Plastics;  Laterality: N/A;  . OVARIAN CYST REMOVAL    . REDUCTION MAMMAPLASTY Bilateral   . ROBOTIC ASSISTED LAPAROSCOPIC HYSTERECTOMY AND SALPINGECTOMY Bilateral 10/06/2018   Procedure: XI ROBOTIC ASSISTED TOTAL LAPAROSCOPIC HYSTERECTOMY AND SALPINGECTOMY;  Surgeon: [name], MD;  Location[facility];  Service: Gynecology;  Laterality: Bilateral;  . ROBOTIC ASSISTED LAPAROSCOPIC LYSIS OF ADHESION  10/06/2018   Procedure: XI ROBOTIC ASSISTED LAPAROSCOPIC LYSIS OF ADHESION;  Surgeon: [name], MD;  [facility];  Service: Gynecology;;    FAMILY HISTORY: Family History  Problem Relation Age of Onset  . Thyroid disease Mother   . Diabetes Mother   . Hypertension Mother   . Heart disease Mother   . Diabetes Father   . Thyroid disease Brother   . Colon cancer Neg Hx   . Stomach cancer Neg Hx   . Rectal cancer Neg Hx   . Esophageal cancer Neg Hx   . Liver cancer Neg Hx   . Breast cancer Neg Hx     SOCIAL HISTORY: Social History   Socioeconomic History  . Marital status: Married    Spouse name: Not on file  . Number of children: 0  . Years of education: college  . Highest education level: Not on file  Occupational History  . Occupation: Buyer, retail  Tobacco Use  . Smoking status: Never Smoker  . Smokeless tobacco: Never Used  Substance and Sexual Activity  . Alcohol use: No    Alcohol/week: 0.0 standard drinks  . Drug use: No  . Sexual activity: Yes    Partners: Male    Birth control/protection: None    Comment: patient's husband  with vasectomy  Other Topics Concern  . Not on file  Social History Narrative   Lives at home with husband.   Right-handed.   One cup caffeine per day.   Social Determinants of Health   Financial Resource Strain:   . Difficulty of Paying Living Expenses:   Food Insecurity:   . Worried About Charity fundraiser in the Last Year:   . Arboriculturist in the Last Year:   Transportation Needs:   . Film/video editor (Medical):   Marland Kitchen Lack of Transportation (Non-Medical):   Physical Activity:   . Days of Exercise per Week:   . Minutes of Exercise per Session:   Stress:   . Feeling of Stress :   Social Connections:   . Frequency of Communication with Friends and Family:   .  Frequency of Social Gatherings with Friends and Family:   . Attends Religious Services:   . Active Member of Clubs or Organizations:   . Attends Archivist Meetings:   Marland Kitchen Marital Status:   Intimate Partner Violence:   . Fear of Current or Ex-Partner:   . Emotionally Abused:   Marland Kitchen Physically Abused:   . Sexually Abused:     PHYSICAL EXAM  Vitals:   04/18/20 0751  BP: 133/82  Pulse: 91  Temp: (!) 97.1 F (36.2 C)  Weight: 245 lb (111.1 kg)  Height: 5\' 5"  (1.651 m)   Body mass index is 40.77 kg/m.  Generalized: Well developed, in no acute distress   Neurological examination  Mentation: Alert oriented to time, place, history taking. Follows all commands speech and language fluent Cranial nerve II-XII: Pupils were equal round reactive to light. Extraocular movements were full, visual field were full on confrontational test. Facial sensation and strength were normal. Head turning and shoulder shrug  were normal and symmetric. Motor: The motor testing reveals 5 over 5 strength of all 4 extremities. Good symmetric motor tone is noted throughout.  Sensory: Sensory testing is intact to soft touch on all 4 extremities. No evidence of extinction is noted.  Coordination: Cerebellar testing reveals  good finger-nose-finger and heel-to-shin bilaterally.  Gait and station: Gait is normal. Tandem gait is normal. Romberg is negative. No drift is seen.  Reflexes: Deep tendon reflexes are symmetric and normal bilaterally.   DIAGNOSTIC DATA (LABS, IMAGING, TESTING) - I reviewed patient records, labs, notes, testing and imaging myself where available.  Lab Results  Component Value Date   WBC 10.8 (H) 10/07/2018   HGB 13.0 10/07/2018   HCT 39.9 10/07/2018   MCV 91.9 10/07/2018   PLT 260 10/07/2018   No results found for: NA, K, CL, CO2, GLUCOSE, BUN, CREATININE, CALCIUM, PROT, ALBUMIN, AST, ALT, ALKPHOS, BILITOT, GFRNONAA, GFRAA No results found for: CHOL, HDL, LDLCALC, LDLDIRECT, TRIG, CHOLHDL No results found for: HGBA1C No results found for: VITAMINB12 Lab Results  Component Value Date   TSH 1.72 05/23/2017    ASSESSMENT AND PLAN 49 y.o. year old female  has a past medical history of Complication of anesthesia, Ectopic pregnancy, Fibroids, GERD (gastroesophageal reflux disease), Headache, Mass, Obesity, Pneumonia (2 years ago ), and PONV (postoperative nausea and vomiting). here with:  1.  Chronic migraine with aura -Overall, her headaches are currently well controlled, but still has aura and mild heaache, however much less 60-70% better, does have cognitive side effect with Topamax -Continue Topamax 100 mg twice a day -Add on nortriptyline working up to 20 mg at bedtime, after few weeks, if headaches are better, we discussed trying a dose reduction of Topamax 50 mg am/100 mg pm, even down to 100 mg at bedtime to lessen side effect, she really wants to stay on Topamax, because it has been so helpful, could possibly offer extended release Topamax in future  -Reports history of asthma, did not offer propanolol -Continue Maxalt 5 mg as needed, retry  -Follow-up in 4 months or sooner if needed  I spent 30 minutes of face-to-face and non-face-to-face time with patient.  This included  previsit chart review, lab review, study review, order entry, electronic health record documentation, patient education.  Butler Denmark, AGNP-C, DNP 04/18/2020, 7:56 AM [facility] [address] [place], [place] [address] [phone]

## 2020-04-18 ENCOUNTER — Other Ambulatory Visit: Payer: Self-pay

## 2020-04-18 ENCOUNTER — Encounter: Payer: Self-pay | Admitting: Neurology

## 2020-04-18 ENCOUNTER — Ambulatory Visit (INDEPENDENT_AMBULATORY_CARE_PROVIDER_SITE_OTHER): Payer: 59 | Admitting: Neurology

## 2020-04-18 VITALS — BP 133/82 | HR 91 | Temp 97.1°F | Ht 65.0 in | Wt 245.0 lb

## 2020-04-18 DIAGNOSIS — G43109 Migraine with aura, not intractable, without status migrainosus: Secondary | ICD-10-CM

## 2020-04-18 MED ORDER — NORTRIPTYLINE HCL 10 MG PO CAPS: ORAL_CAPSULE | ORAL | 5 refills | Status: DC

## 2020-04-18 NOTE — Patient Instructions (Addendum)
It was nice to see you today Let's start the nortriptyline 10 mg at bedtime x 1 week, then take 20 mg at bedtime, if you do well after a few weeks try to decrease your dose of Topamax to 50 mg in the morning 100 mg at bedtime  Try to keep a log of your headaches See you back in 4 months

## 2020-04-25 NOTE — Progress Notes (Signed)
I have reviewed and agreed above plan. 

## 2020-06-21 ENCOUNTER — Other Ambulatory Visit: Payer: Self-pay | Admitting: *Deleted

## 2020-06-21 DIAGNOSIS — R1011 Right upper quadrant pain: Secondary | ICD-10-CM

## 2020-06-22 ENCOUNTER — Ambulatory Visit
Admission: RE | Admit: 2020-06-22 | Discharge: 2020-06-22 | Disposition: A | Discharge status: Home / Self Care | Payer: 59 | Source: Ambulatory Visit | Attending: *Deleted | Admitting: *Deleted

## 2020-06-22 DIAGNOSIS — R1011 Right upper quadrant pain: Secondary | ICD-10-CM

## 2020-08-09 ENCOUNTER — Other Ambulatory Visit (HOSPITAL_COMMUNITY): Payer: Self-pay | Admitting: Gastroenterology

## 2020-08-09 ENCOUNTER — Other Ambulatory Visit: Payer: Self-pay | Admitting: Gastroenterology

## 2020-08-09 DIAGNOSIS — R1011 Right upper quadrant pain: Secondary | ICD-10-CM

## 2020-08-16 ENCOUNTER — Encounter (HOSPITAL_COMMUNITY): Payer: 59

## 2020-08-22 ENCOUNTER — Ambulatory Visit: Payer: 59 | Admitting: Neurology

## 2020-09-05 ENCOUNTER — Encounter (HOSPITAL_COMMUNITY)
Admission: RE | Admit: 2020-09-05 | Discharge: 2020-09-05 | Disposition: A | Discharge status: Home / Self Care | Payer: 59 | Source: Ambulatory Visit | Attending: Gastroenterology | Admitting: Gastroenterology

## 2020-09-05 ENCOUNTER — Other Ambulatory Visit: Payer: Self-pay

## 2020-09-05 DIAGNOSIS — R1011 Right upper quadrant pain: Secondary | ICD-10-CM | POA: Insufficient documentation

## 2020-09-05 MED ORDER — TECHNETIUM TC 99M MEBROFENIN IV KIT
5.0000 | PACK | Freq: Once | INTRAVENOUS | Status: AC | PRN
  Administered 2020-09-05: 5 via INTRAVENOUS

## 2020-09-07 ENCOUNTER — Ambulatory Visit (INDEPENDENT_AMBULATORY_CARE_PROVIDER_SITE_OTHER): Payer: 59

## 2020-09-07 ENCOUNTER — Other Ambulatory Visit: Payer: Self-pay

## 2020-09-07 ENCOUNTER — Ambulatory Visit: Payer: 59 | Admitting: Sports Medicine

## 2020-09-07 ENCOUNTER — Encounter: Payer: Self-pay | Admitting: Sports Medicine

## 2020-09-07 DIAGNOSIS — B3731 Acute candidiasis of vulva and vagina: Secondary | ICD-10-CM | POA: Insufficient documentation

## 2020-09-07 DIAGNOSIS — M79672 Pain in left foot: Secondary | ICD-10-CM

## 2020-09-07 DIAGNOSIS — M2142 Flat foot [pes planus] (acquired), left foot: Secondary | ICD-10-CM

## 2020-09-07 DIAGNOSIS — M7752 Other enthesopathy of left foot: Secondary | ICD-10-CM

## 2020-09-07 DIAGNOSIS — M2141 Flat foot [pes planus] (acquired), right foot: Secondary | ICD-10-CM

## 2020-09-07 DIAGNOSIS — R8290 Unspecified abnormal findings in urine: Secondary | ICD-10-CM | POA: Insufficient documentation

## 2020-09-07 DIAGNOSIS — M79671 Pain in right foot: Secondary | ICD-10-CM

## 2020-09-07 DIAGNOSIS — R300 Dysuria: Secondary | ICD-10-CM | POA: Insufficient documentation

## 2020-09-07 DIAGNOSIS — M779 Enthesopathy, unspecified: Secondary | ICD-10-CM | POA: Diagnosis not present

## 2020-09-07 DIAGNOSIS — J0140 Acute pansinusitis, unspecified: Secondary | ICD-10-CM | POA: Insufficient documentation

## 2020-09-07 DIAGNOSIS — G8929 Other chronic pain: Secondary | ICD-10-CM

## 2020-09-07 MED ORDER — TRIAMCINOLONE ACETONIDE 10 MG/ML IJ SUSP
10.0000 mg | Freq: Once | INTRAMUSCULAR | Status: AC
  Administered 2020-09-07: 10 mg

## 2020-09-07 NOTE — Progress Notes (Signed)
Subjective: Stefanie Baldwin is a 49 y.o. female patient who presents to office for evaluation of Left foot pain. Patient complains of progressive pain especially over the last several weeks in the left foot at the side that started after increased walking at work and having to do more. Ranks pain 7/10 and is now interferring with daily activities even has pain with sitting. Patient has tried changing shoes. Patient admits to a chronic history of right foot pain at heel, history of plantar fasciitis but does not want this foot checked; reports that she will live the pain on the right but the pain on the left is new and hurts worse. Patient denies any other pedal complaints. Denies injury/trip/fall/sprain/any other causative factors.   Review of Systems  All other systems reviewed and are negative.   Patient Active Problem List   Diagnosis Date Noted  . Acute pansinusitis 09/07/2020  . Candidal vulvovaginitis 09/07/2020  . Dysuria 09/07/2020  . Urine finding 09/07/2020  . Chronic migraine with aura 01/18/2020  . Post-operative state 10/06/2018  . Eschar of toe 06/14/2015  . Metatarsal deformity 06/14/2015  . Discoloration of skin of foot 06/07/2014    Current Outpatient Medications on File Prior to Visit  Medication Sig Dispense Refill  . azithromycin (ZITHROMAX) 250 MG tablet azithromycin 250 mg tablet  2 TABS PO QD X 1 DAY THEN 1 TAB PO QD X 4 DAYS    . benzonatate (TESSALON) 200 MG capsule benzonatate 200 mg capsule    . betamethasone valerate (VALISONE) 0.1 % cream Apply 1 application topically daily as needed (eczema).   6  . cetirizine (ZYRTEC) 10 MG tablet Take 10 mg by mouth daily.    . ciprofloxacin (CIPRO) 500 MG tablet SMARTSIG:1 Tablet(s) By Mouth Every 12 Hours    . Dapsone 7.5 % GEL Apply topically daily.    . fexofenadine (ALLEGRA) 180 MG tablet fexofenadine 180 mg tablet  TAKE 1 TABLET BY MOUTH EVERY DAY    . fluconazole (DIFLUCAN) 150 MG tablet Take 150 mg by mouth once.     . hydrOXYzine (ATARAX/VISTARIL) 25 MG tablet hydroxyzine HCl 25 mg tablet  TAKE 1 TABLET BY MOUTH THREE TIMES A DAY AS NEEDED FOR 10 DAYS    . loratadine (CLARITIN) 10 MG tablet loratadine 10 mg tablet  TAKE 1 TABLET BY MOUTH EVERY DAY    . metroNIDAZOLE (FLAGYL) 500 MG tablet SMARTSIG:1 Tablet(s) By Mouth Every 12 Hours    . nitrofurantoin, macrocrystal-monohydrate, (MACROBID) 100 MG capsule Take 100 mg by mouth 2 (two) times daily.    . phentermine (ADIPEX-P) 37.5 MG tablet Take 37.5 mg by mouth daily.    . rizatriptan (MAXALT-MLT) 5 MG disintegrating tablet Take 1 tablet (5 mg total) by mouth as needed. May repeat in 2 hours if needed 15 tablet 12  . spironolactone (ALDACTONE) 50 MG tablet Take 50 mg by mouth 2 (two) times daily.    Marland Kitchen topiramate (TOPAMAX) 100 MG tablet Take 1 tablet (100 mg total) by mouth 2 (two) times daily. 60 tablet 11  . tretinoin (RETIN-A) 0.025 % cream tretinoin 0.025 % topical cream    . triamcinolone cream (KENALOG) 0.1 % triamcinolone acetonide 0.1 % topical cream  APPLY TWICE A DAY ON AFFECTED AREAS (HANDS) . DO NOT USE ON FACE    . Vitamin D, Ergocalciferol, (DRISDOL) 50000 UNITS CAPS capsule Take 50,000 Units by mouth every 7 (seven) days. Sunday  3   No current facility-administered medications on file prior to visit.  Allergies  Allergen Reactions  . Other     Pt does not want to receive any BLOOD     Objective:  General: Alert and oriented x3 in no acute distress  Dermatology: No open lesions bilateral lower extremities, no webspace macerations, no ecchymosis bilateral, all nails x 10 are well manicured. Old surgical scars.   Vascular: Dorsalis Pedis and Posterior Tibial pedal pulses palpable, Capillary Fill Time 3 seconds,(+) pedal hair growth bilateral, no edema bilateral lower extremities, Temperature gradient within normal limits.  Neurology: Johney Maine sensation intact via light touch bilateral.  Musculoskeletal: Mild to moderate tenderness  with palpation at left foot at 5th met base and along the peroneal tendon left foot, + pes planus, Chronic right heel pain, Strength within normal limits in all groups bilateral.   Gait: Antalgic gait  Xrays  Left Foot   Impression:Midtarsal breach supportive of pes planus, spur at heel and avulsion fleck at 5th met base, no other acute osseous findings.  Assessment and Plan: Problem List Items Addressed This Visit    None    Visit Diagnoses    Pain in left foot    -  Primary   Relevant Orders   DG Foot Complete Left   Tendonitis       Relevant Medications   triamcinolone acetonide (KENALOG) 10 MG/ML injection 10 mg (Completed) (Start on 09/07/2020  7:00 PM)   Capsulitis       Foot pain, left       Chronic foot pain, right       Relevant Medications   triamcinolone acetonide (KENALOG) 10 MG/ML injection 10 mg (Completed) (Start on 09/07/2020  7:00 PM)   Pes planus of both feet           -Complete examination performed -Xrays reviewed -Discussed treatment options -After oral consent and aseptic prep, injected a mixture containing 1 ml of 2%  plain lidocaine, 1 ml 0.5% plain marcaine, 0.5 ml of kenalog 10 and 0.5 ml of dexamethasone phosphate into left lateral foot at 5th met base without complication. Post-injection care discussed with patient.  -Dispensed fascial brace and advised patient to strap from medial and lateral and to wear as instructed on the left foot -Recommend rest, ice, elevation, and good supportive shoes -Patient to return to office in 3-4 weeks or sooner if condition worsens.  Landis Martins, DPM

## 2020-10-05 ENCOUNTER — Ambulatory Visit: Payer: 59 | Admitting: Sports Medicine

## 2020-10-05 ENCOUNTER — Other Ambulatory Visit: Payer: Self-pay

## 2020-10-05 ENCOUNTER — Encounter: Payer: Self-pay | Admitting: Sports Medicine

## 2020-10-05 DIAGNOSIS — M79671 Pain in right foot: Secondary | ICD-10-CM

## 2020-10-05 DIAGNOSIS — G8929 Other chronic pain: Secondary | ICD-10-CM | POA: Diagnosis not present

## 2020-10-05 DIAGNOSIS — M79672 Pain in left foot: Secondary | ICD-10-CM

## 2020-10-05 DIAGNOSIS — M779 Enthesopathy, unspecified: Secondary | ICD-10-CM

## 2020-10-05 MED ORDER — MELOXICAM 15 MG PO TABS: 15.0000 mg | ORAL_TABLET | Freq: Every day | ORAL | 0 refills | Status: DC

## 2020-10-05 NOTE — Progress Notes (Signed)
Subjective: Stefanie Baldwin is a 49 y.o. female patient who returns to office for follow up evaluation of left foot pain. Reports that her pain is doing a little better but still has some soreness or fatigue to the area. Reports that brace fits funny. No other issues noted.   Patient Active Problem List   Diagnosis Date Noted  . Acute pansinusitis 09/07/2020  . Candidal vulvovaginitis 09/07/2020  . Dysuria 09/07/2020  . Urine finding 09/07/2020  . Chronic migraine with aura 01/18/2020  . Post-operative state 10/06/2018  . Eschar of toe 06/14/2015  . Metatarsal deformity 06/14/2015  . Discoloration of skin of foot 06/07/2014    Current Outpatient Medications on File Prior to Visit  Medication Sig Dispense Refill  . azithromycin (ZITHROMAX) 250 MG tablet azithromycin 250 mg tablet  2 TABS PO QD X 1 DAY THEN 1 TAB PO QD X 4 DAYS    . benzonatate (TESSALON) 200 MG capsule benzonatate 200 mg capsule    . betamethasone valerate (VALISONE) 0.1 % cream Apply 1 application topically daily as needed (eczema).   6  . cetirizine (ZYRTEC) 10 MG tablet Take 10 mg by mouth daily.    . ciprofloxacin (CIPRO) 500 MG tablet SMARTSIG:1 Tablet(s) By Mouth Every 12 Hours    . Dapsone 7.5 % GEL Apply topically daily.    . fexofenadine (ALLEGRA) 180 MG tablet fexofenadine 180 mg tablet  TAKE 1 TABLET BY MOUTH EVERY DAY    . fluconazole (DIFLUCAN) 150 MG tablet Take 150 mg by mouth once.    . hydrOXYzine (ATARAX/VISTARIL) 25 MG tablet hydroxyzine HCl 25 mg tablet  TAKE 1 TABLET BY MOUTH THREE TIMES A DAY AS NEEDED FOR 10 DAYS    . loratadine (CLARITIN) 10 MG tablet loratadine 10 mg tablet  TAKE 1 TABLET BY MOUTH EVERY DAY    . metroNIDAZOLE (FLAGYL) 500 MG tablet SMARTSIG:1 Tablet(s) By Mouth Every 12 Hours    . nitrofurantoin, macrocrystal-monohydrate, (MACROBID) 100 MG capsule Take 100 mg by mouth 2 (two) times daily.    . phentermine (ADIPEX-P) 37.5 MG tablet Take 37.5 mg by mouth daily.    .  rizatriptan (MAXALT-MLT) 5 MG disintegrating tablet Take 1 tablet (5 mg total) by mouth as needed. May repeat in 2 hours if needed 15 tablet 12  . spironolactone (ALDACTONE) 50 MG tablet Take 50 mg by mouth 2 (two) times daily.    Marland Kitchen topiramate (TOPAMAX) 100 MG tablet Take 1 tablet (100 mg total) by mouth 2 (two) times daily. 60 tablet 11  . tretinoin (RETIN-A) 0.025 % cream tretinoin 0.025 % topical cream    . triamcinolone cream (KENALOG) 0.1 % triamcinolone acetonide 0.1 % topical cream  APPLY TWICE A DAY ON AFFECTED AREAS (HANDS) . DO NOT USE ON FACE    . Vitamin D, Ergocalciferol, (DRISDOL) 50000 UNITS CAPS capsule Take 50,000 Units by mouth every 7 (seven) days. Sunday  3   No current facility-administered medications on file prior to visit.    Allergies  Allergen Reactions  . Other     Pt does not want to receive any BLOOD     Objective:  General: Alert and oriented x3 in no acute distress  Dermatology: No open lesions bilateral lower extremities, no webspace macerations, no ecchymosis bilateral, all nails x 10 are well manicured. Old surgical scars.   Vascular: Dorsalis Pedis and Posterior Tibial pedal pulses palpable, Capillary Fill Time 3 seconds,(+) pedal hair growth bilateral, no edema bilateral lower extremities, Temperature gradient  within normal limits.  Neurology: Gross sensation intact via light touch bilateral.  Musculoskeletal: Mild  tenderness with palpation at left foot at 5th met base and along the peroneal tendon left foot but better than previous, + pes planus, Chronic right heel pain, Strength within normal limits in all groups bilateral.    Assessment and Plan: Problem List Items Addressed This Visit    None    Visit Diagnoses    Tendonitis    -  Primary   Capsulitis       Pain in left foot       Chronic foot pain, right           -Complete examination performed -Discussed treatment options for continued tendonitis   -Continue with fascial brace and  advised patient to strap from medial and lateral and to wear as instructed on the left foot with dot at the front -Rx Mobic to take as instructed  -Recommend rest, ice, elevation, and good supportive shoes -Patient to return to office after 1 month if no better.  Landis Martins, DPM

## 2020-10-06 ENCOUNTER — Other Ambulatory Visit: Payer: Self-pay | Admitting: Sports Medicine

## 2020-10-06 DIAGNOSIS — M7752 Other enthesopathy of left foot: Secondary | ICD-10-CM

## 2020-11-18 ENCOUNTER — Ambulatory Visit: Payer: 59 | Attending: Internal Medicine

## 2020-11-18 DIAGNOSIS — Z23 Encounter for immunization: Secondary | ICD-10-CM

## 2020-11-18 NOTE — Progress Notes (Signed)
   Covid-19 Vaccination [facility]  Name:  Stefanie Baldwin    MRN: [id number] DOB: 10-Sep-1971  11/18/2020  [name] was observed post Covid-19 immunization for 15 minutes without incident. She was provided with Vaccine Information Sheet and instruction to access the V-Safe system.   [name] was instructed to call 911 with any severe reactions post vaccine: Marland Kitchen Difficulty breathing  . Swelling of face and throat  . A fast heartbeat  . A bad rash all over body  . Dizziness and weakness   Immunizations Administered    No immunizations on file.

## 2020-11-21 ENCOUNTER — Encounter: Payer: 59 | Admitting: Obstetrics & Gynecology

## 2020-11-23 ENCOUNTER — Encounter: Payer: 59 | Admitting: Obstetrics & Gynecology

## 2020-12-04 ENCOUNTER — Encounter: Payer: 59 | Admitting: Obstetrics & Gynecology

## 2020-12-27 ENCOUNTER — Other Ambulatory Visit: Payer: Self-pay | Admitting: Neurology

## 2021-01-12 ENCOUNTER — Other Ambulatory Visit: Payer: Self-pay | Admitting: Neurology

## 2021-01-19 ENCOUNTER — Ambulatory Visit: Payer: 59 | Admitting: Obstetrics and Gynecology

## 2021-01-19 ENCOUNTER — Encounter: Payer: Self-pay | Admitting: Obstetrics and Gynecology

## 2021-01-19 ENCOUNTER — Other Ambulatory Visit: Payer: Self-pay

## 2021-01-19 VITALS — BP 114/70 | HR 92 | Temp 98.2°F | Wt 246.2 lb

## 2021-01-19 DIAGNOSIS — R109 Unspecified abdominal pain: Secondary | ICD-10-CM | POA: Diagnosis not present

## 2021-01-19 DIAGNOSIS — N898 Other specified noninflammatory disorders of vagina: Secondary | ICD-10-CM | POA: Diagnosis not present

## 2021-01-19 LAB — WET PREP FOR TRICH, YEAST, CLUE

## 2021-01-19 NOTE — Progress Notes (Signed)
Stefanie Baldwin 1971-04-09 [id number]  SUBJECTIVE:  50 y.o. G73P0 female presents for right flank pain and lower abdominal pains for 3 to 4 days and urinary leakage in that timeframe, no dysuria or increased frequency.  No fever or abnormal vaginal discharge, no vaginal bleeding.  Prior robotic TLH for endometriosis, no HRT.  Took 1 tablet of leftover fluconazole at home.   Current Outpatient Medications  Medication Sig Dispense Refill  . azithromycin (ZITHROMAX) 250 MG tablet azithromycin 250 mg tablet  2 TABS PO QD X 1 DAY THEN 1 TAB PO QD X 4 DAYS    . benzonatate (TESSALON) 200 MG capsule benzonatate 200 mg capsule    . betamethasone valerate (VALISONE) 0.1 % cream Apply 1 application topically daily as needed (eczema).   6  . cetirizine (ZYRTEC) 10 MG tablet Take 10 mg by mouth daily.    . ciprofloxacin (CIPRO) 500 MG tablet SMARTSIG:1 Tablet(s) By Mouth Every 12 Hours    . Dapsone 7.5 % GEL Apply topically daily.    . fexofenadine (ALLEGRA) 180 MG tablet fexofenadine 180 mg tablet  TAKE 1 TABLET BY MOUTH EVERY DAY    . fluconazole (DIFLUCAN) 150 MG tablet Take 150 mg by mouth once.    . hydrOXYzine (ATARAX/VISTARIL) 25 MG tablet hydroxyzine HCl 25 mg tablet  TAKE 1 TABLET BY MOUTH THREE TIMES A DAY AS NEEDED FOR 10 DAYS    . loratadine (CLARITIN) 10 MG tablet loratadine 10 mg tablet  TAKE 1 TABLET BY MOUTH EVERY DAY    . meloxicam (MOBIC) 15 MG tablet Take 1 tablet (15 mg total) by mouth daily. 30 tablet 0  . metroNIDAZOLE (FLAGYL) 500 MG tablet SMARTSIG:1 Tablet(s) By Mouth Every 12 Hours    . nitrofurantoin, macrocrystal-monohydrate, (MACROBID) 100 MG capsule Take 100 mg by mouth 2 (two) times daily.    . phentermine (ADIPEX-P) 37.5 MG tablet Take 37.5 mg by mouth daily.    . rizatriptan (MAXALT-MLT) 5 MG disintegrating tablet Take 1 tablet (5 mg total) by mouth as needed. May repeat in 2 hours if needed 15 tablet 12  . spironolactone (ALDACTONE) 50 MG tablet Take 50 mg by mouth 2  (two) times daily.    Marland Kitchen topiramate (TOPAMAX) 100 MG tablet Take 1 tablet (100 mg total) by mouth 2 (two) times daily. 60 tablet 11  . tretinoin (RETIN-A) 0.025 % cream tretinoin 0.025 % topical cream    . triamcinolone cream (KENALOG) 0.1 % triamcinolone acetonide 0.1 % topical cream  APPLY TWICE A DAY ON AFFECTED AREAS (HANDS) . DO NOT USE ON FACE    . Vitamin D, Ergocalciferol, (DRISDOL) 50000 UNITS CAPS capsule Take 50,000 Units by mouth every 7 (seven) days. Sunday  3   No current facility-administered medications for this visit.   Allergies: Other  Patient's last menstrual period was 09/27/2018.  Past medical history,surgical history, problem list, medications, allergies, family history and social history were all reviewed and documented as reviewed in the EPIC chart.  ROS: Pertinent positives and negatives as reviewed in HPI  OBJECTIVE:  BP 114/70 (BP Location: Right Arm, Cuff Size: Large)   Pulse 92   Temp 98.2 F (36.8 C)   Wt 246 lb 3.2 oz (111.7 kg)   LMP 09/27/2018   BMI 40.97 kg/m  The patient appears well, alert, oriented, in no distress.  PELVIC EXAM: VULVA: normal appearing vulva with no masses, tenderness or lesions, VAGINA: normal appearing vagina with normal color and scant white thick discharge, no  lesions, CERVIX: surgically absent, UTERUS: surgically absent, vaginal cuff normal, WET MOUNT done - results: negative for pathogens, normal epithelial cells UA 6-10 WBC, 0-2 RBC, 6-10 squamous epithelial cells, moderate bacteria, mucus present, orange and cloudy color  Chaperone: Lamont Snowball RN present during the examination  ASSESSMENT:  50 y.o. G2P0 here for right flank and back pain  PLAN:  Urine culture pending, UA not obviously indicating UTI today so will hold off on antibiotic treatment for right now, will follow up urine culture.  Vaginal wet mount negative.  Will try OTC pain medications and/or ice or heat pack.  Return for evaluation if the pain symptoms  not improved or get worse. Plans to return to urology for further recommendations regarding urinary incontinence.   Joseph Pierini MD 01/19/21

## 2021-01-21 LAB — URINALYSIS, COMPLETE W/RFL CULTURE
Bilirubin Urine: NEGATIVE
Glucose, UA: NEGATIVE
Hyaline Cast: NONE SEEN /LPF
Leukocyte Esterase: NEGATIVE
Nitrites, Initial: NEGATIVE
Specific Gravity, Urine: 1.025 (ref 1.001–1.03)
pH: 5.5 (ref 5.0–8.0)

## 2021-01-21 LAB — URINE CULTURE
MICRO NUMBER:: 11496850
SPECIMEN QUALITY:: ADEQUATE

## 2021-01-21 LAB — CULTURE INDICATED

## 2021-01-23 ENCOUNTER — Encounter: Payer: Self-pay | Admitting: Obstetrics and Gynecology

## 2021-03-23 ENCOUNTER — Encounter: Payer: Self-pay | Admitting: Obstetrics & Gynecology

## 2021-03-23 ENCOUNTER — Ambulatory Visit: Payer: 59 | Admitting: Obstetrics & Gynecology

## 2021-03-23 ENCOUNTER — Other Ambulatory Visit: Payer: Self-pay

## 2021-03-23 VITALS — BP 126/78 | Ht 64.0 in | Wt 244.0 lb

## 2021-03-23 DIAGNOSIS — Z90710 Acquired absence of both cervix and uterus: Secondary | ICD-10-CM

## 2021-03-23 DIAGNOSIS — Z01419 Encounter for gynecological examination (general) (routine) without abnormal findings: Secondary | ICD-10-CM

## 2021-03-23 DIAGNOSIS — N951 Menopausal and female climacteric states: Secondary | ICD-10-CM

## 2021-03-23 DIAGNOSIS — L680 Hirsutism: Secondary | ICD-10-CM

## 2021-03-23 DIAGNOSIS — Z6841 Body Mass Index (BMI) 40.0 and over, adult: Secondary | ICD-10-CM

## 2021-03-23 NOTE — Progress Notes (Signed)
Stefanie Baldwin 03/19/1971 [id number]   History:    50 y.o. G2P1A1L1  RP:  Establishef patient presenting for annual gyn exam   HPI: S/P XI Robotic TLH/Bilateral Salpingectomy/Lysis of Adhesions in 09/2018.  Hot flushes increasing.  C/O increased facial hair.  Had 1 electrolysis treatment.  No pelvic pain.  Breasts normal.  BMI 41.88.  Health Labs with [facility] NP.  Colono not done yet.  Past medical history,surgical history, family history and social history were all reviewed and documented in the EPIC chart.  Gynecologic History Patient's last menstrual period was 09/27/2018.  Obstetric History OB History  Gravida Para Term Preterm AB Living  2 1     1     SAB IAB Ectopic Multiple Live Births      1        # Outcome Date GA Lbr Len/2nd Weight Sex Delivery Anes PTL Lv  2 Para           1 Ectopic              ROS: A ROS was performed and pertinent positives and negatives are included in the history.  GENERAL: No fevers or chills. HEENT: No change in vision, no earache, sore throat or sinus congestion. NECK: No pain or stiffness. CARDIOVASCULAR: No chest pain or pressure. No palpitations. PULMONARY: No shortness of breath, cough or wheeze. GASTROINTESTINAL: No abdominal pain, nausea, vomiting or diarrhea, melena or bright red blood per rectum. GENITOURINARY: No urinary frequency, urgency, hesitancy or dysuria. MUSCULOSKELETAL: No joint or muscle pain, no back pain, no recent trauma. DERMATOLOGIC: No rash, no itching, no lesions. ENDOCRINE: No polyuria, polydipsia, no heat or cold intolerance. No recent change in weight. HEMATOLOGICAL: No anemia or easy bruising or bleeding. NEUROLOGIC: No headache, seizures, numbness, tingling or weakness. PSYCHIATRIC: No depression, no loss of interest in normal activity or change in sleep pattern.     Exam:   BP 126/78   Ht 5\' 4"  (1.626 m)   Wt 244 lb (110.7 kg)   LMP 09/27/2018   BMI 41.88 kg/m   Body mass index is 41.88 kg/m.  General  appearance : Well developed well nourished female. No acute distress HEENT: Eyes: no retinal hemorrhage or exudates,  Neck supple, trachea midline, no carotid bruits, no thyroidmegaly Lungs: Clear to auscultation, no rhonchi or wheezes, or rib retractions  Heart: Regular rate and rhythm, no murmurs or gallops Breast:Examined in sitting and supine position were symmetrical in appearance, no palpable masses or tenderness,  no skin retraction, no nipple inversion, no nipple discharge, no skin discoloration, no axillary or supraclavicular lymphadenopathy Abdomen: no palpable masses or tenderness, no rebound or guarding Extremities: no edema or skin discoloration or tenderness  Pelvic: Vulva: Normal             Vagina: No gross lesions or discharge  Cervix/Uterus absent  Adnexa  Without masses or tenderness  Anus: Normal   Assessment/Plan:  50 y.o. female for annual exam   1. Well female exam with routine gynecological exam Gynecologic exam s/p XI Robotic TLH/Bilateral Salpingectomy in 2019, patho benign.  No indication for a Pap test.  Breasts normal.  Will schedule screening mammo.  Colono to do this year.  Health labs with Fam NP.  2. S/P total hysterectomy XI Robotic TLH/Bilateral Salpingectomy in 2019, patho benign.   3. Hot flushes, perimenopausal Will check FSH, if menopausal, may start on Estradiol patch. 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  Continue  with electrolysis. - Testos,Total,Free and SHBG (Female)  5. Class 3 severe obesity due to excess calories without serious comorbidity with body mass index (BMI) of 40.0 to 44.9 in adult [facility]) Recommend a lower Calorie/Carb diet.  Aerobic activities 5 x a week with light weight lifting every 2 days.  [name] MD, 8:53 AM 03/23/2021

## 2021-03-25 ENCOUNTER — Encounter: Payer: Self-pay | Admitting: Obstetrics & Gynecology

## 2021-03-26 ENCOUNTER — Telehealth: Payer: Self-pay | Admitting: *Deleted

## 2021-03-26 NOTE — Telephone Encounter (Signed)
Patient informed with below note, patient would like to start on patch. Please advise

## 2021-03-26 NOTE — Telephone Encounter (Signed)
-----   Message from [name], MD sent at 03/25/2021 11:40 AM EDT ----- [facility] in Menopausal range.  HRT discussed at visit, please see if wants to start on hormones.

## 2021-03-27 NOTE — Telephone Encounter (Signed)
-----   Message from [name], MD sent at 03/25/2021 11:40 AM EDT ----- [facility] in Menopausal range.  HRT discussed at visit, please see if wants to start on hormones.

## 2021-03-27 NOTE — Telephone Encounter (Signed)
Patient advised would like to start hormones. Patient confirmed pharmacy as [facility] on [address]. States she has not scheduled mammogram at this time, but will get that done. Please advise.

## 2021-03-28 LAB — FOLLICLE STIMULATING HORMONE: FSH: 55.3 m[IU]/mL

## 2021-03-28 LAB — TESTOS,TOTAL,FREE AND SHBG (FEMALE)
Free Testosterone: 4 pg/mL (ref 0.1–6.4)
Sex Hormone Binding: 36 nmol/L (ref 17–124)
Testosterone, Total, LC-MS-MS: 24 ng/dL (ref 2–45)

## 2021-03-28 MED ORDER — ESTRADIOL 0.05 MG/24HR TD PTTW: 1.0000 | MEDICATED_PATCH | TRANSDERMAL | 4 refills | Status: DC

## 2021-03-28 NOTE — Telephone Encounter (Signed)
Patient informed, Rx sent.  

## 2021-04-19 ENCOUNTER — Other Ambulatory Visit: Payer: Self-pay

## 2021-04-19 ENCOUNTER — Encounter: Payer: Self-pay | Admitting: Sports Medicine

## 2021-04-19 ENCOUNTER — Ambulatory Visit: Payer: 59 | Admitting: Sports Medicine

## 2021-04-19 DIAGNOSIS — M79672 Pain in left foot: Secondary | ICD-10-CM

## 2021-04-19 DIAGNOSIS — D3610 Benign neoplasm of peripheral nerves and autonomic nervous system, unspecified: Secondary | ICD-10-CM | POA: Diagnosis not present

## 2021-04-19 DIAGNOSIS — M779 Enthesopathy, unspecified: Secondary | ICD-10-CM

## 2021-04-19 MED ORDER — TRIAMCINOLONE ACETONIDE 10 MG/ML IJ SUSP
10.0000 mg | Freq: Once | INTRAMUSCULAR | Status: AC
  Administered 2021-04-19: 10 mg

## 2021-04-19 NOTE — Progress Notes (Signed)
Subjective: Stefanie Baldwin is a 50 y.o. female patient who presents to office for evaluation of left foot pain. Patient complains of progressive pain especially over the last 1-2 months in the toes of the left foot especially the 4th toe. Feels numb. Patient has tried rest with no relief in symptoms but does state that she has been doing more walking at work. Patient denies any other pedal complaints. Denies injury/trip/fall/sprain/any causative factors.   Patient Active Problem List   Diagnosis Date Noted  . Acute pansinusitis 09/07/2020  . Candidal vulvovaginitis 09/07/2020  . Dysuria 09/07/2020  . Urine finding 09/07/2020  . Chronic migraine with aura 01/18/2020  . Post-operative state 10/06/2018  . Eschar of toe 06/14/2015  . Metatarsal deformity 06/14/2015  . Discoloration of skin of foot 06/07/2014    Current Outpatient Medications on File Prior to Visit  Medication Sig Dispense Refill  . cetirizine (ZYRTEC) 10 MG tablet Take 10 mg by mouth daily.    Marland Kitchen estradiol (VIVELLE-DOT) 0.05 MG/24HR patch Place 1 patch (0.05 mg total) onto the skin 2 (two) times a week. 24 patch 4  . fexofenadine (ALLEGRA) 180 MG tablet fexofenadine 180 mg tablet  TAKE 1 TABLET BY MOUTH EVERY DAY    . hydrOXYzine (ATARAX/VISTARIL) 25 MG tablet hydroxyzine HCl 25 mg tablet  TAKE 1 TABLET BY MOUTH THREE TIMES A DAY AS NEEDED FOR 10 DAYS    . loratadine (CLARITIN) 10 MG tablet loratadine 10 mg tablet  TAKE 1 TABLET BY MOUTH EVERY DAY    . topiramate (TOPAMAX) 100 MG tablet Take 1 tablet (100 mg total) by mouth 2 (two) times daily. 60 tablet 11  . triamcinolone cream (KENALOG) 0.1 %     . Vitamin D, Ergocalciferol, (DRISDOL) 50000 UNITS CAPS capsule Take 50,000 Units by mouth every 7 (seven) days. Sunday  3   No current facility-administered medications on file prior to visit.    Allergies  Allergen Reactions  . Other     Pt does not want to receive any BLOOD     Objective:  General: Alert and oriented  x3 in no acute distress  Dermatology: No open lesions bilateral lower extremities, no webspace macerations, no ecchymosis bilateral, all nails x 10 are well manicured.  Keratosis to multiple toes bilateral.  Old surgical scars well-healed.  Vascular: Dorsalis Pedis and Posterior Tibial pedal pulses palpable, Capillary Fill Time 3 seconds,(+) pedal hair growth bilateral, no edema bilateral lower extremities, Temperature gradient within normal limits.  Neurology: Gross sensation intact via light touch bilateral, vibratory intact bilateral.  There is diminished monofilament sensation to the digits especially the left fourth toe.  Musculoskeletal: Mild tenderness with palpation left fourth toe.  Residual hammertoe deformity noted.  Strength within normal limits in all groups bilateral.    Assessment and Plan: Problem List Items Addressed This Visit   None   Visit Diagnoses    Neuroma    -  Primary   Relevant Medications   triamcinolone acetonide (KENALOG) 10 MG/ML injection 10 mg (Start on 04/19/2021 10:00 PM)   Capsulitis       Relevant Medications   triamcinolone acetonide (KENALOG) 10 MG/ML injection 10 mg (Start on 04/19/2021 10:00 PM)   Pain in left foot           -Complete examination performed -Previous xrays reviewed -Discussed treatment options for likely neuroma greater than capsulitis -Rx met pad to wear as directed -After oral consent and aseptic prep, injected a mixture containing 1 ml of  2%  plain lidocaine, 1 ml 0.5% plain marcaine, 0.5 ml of kenalog 10 and 0.5 ml of dexamethasone phosphate into fourth toe of the left foot without complication. Post-injection care discussed with patient.  -Advised good supportive shoes daily for foot type -Patient to return to office after 1 month if no better or sooner if condition worsens.  Landis Martins, DPM

## 2021-06-26 ENCOUNTER — Telehealth: Payer: Self-pay | Admitting: *Deleted

## 2021-06-26 NOTE — Telephone Encounter (Signed)
Left foot,4th toe injury today.

## 2021-06-26 NOTE — Telephone Encounter (Signed)
Patient is calling because she bumped her toe, bruised badly,painful.

## 2021-06-27 ENCOUNTER — Ambulatory Visit (INDEPENDENT_AMBULATORY_CARE_PROVIDER_SITE_OTHER): Payer: 59

## 2021-06-27 ENCOUNTER — Ambulatory Visit: Payer: 59 | Admitting: Podiatry

## 2021-06-27 ENCOUNTER — Other Ambulatory Visit: Payer: Self-pay | Admitting: Podiatry

## 2021-06-27 ENCOUNTER — Other Ambulatory Visit: Payer: Self-pay

## 2021-06-27 DIAGNOSIS — M79672 Pain in left foot: Secondary | ICD-10-CM

## 2021-06-27 DIAGNOSIS — S90122A Contusion of left lesser toe(s) without damage to nail, initial encounter: Secondary | ICD-10-CM

## 2021-06-27 DIAGNOSIS — S9032XA Contusion of left foot, initial encounter: Secondary | ICD-10-CM

## 2021-06-28 ENCOUNTER — Encounter: Payer: Self-pay | Admitting: Podiatry

## 2021-06-28 NOTE — Progress Notes (Signed)
Subjective:  Patient ID: Stefanie Baldwin, female    DOB: 07/02/1971,  MRN: [id number]  Chief Complaint  Patient presents with   Foot Pain    Left foot 4th toe pain    50 y.o. female presents with the above complaint.  Patient presents with complaint of left fourth toe.  Patient states that she hit her foot against a wall and if fourth toe seems to be the most painful.  She wanted to make sure that there is nothing broken.  She has not seen anyone else prior to seeing me.  This injury happened a week ago.  She denies any other acute complaints.  She is known to Dr. Cannon Kettle.   Review of Systems: Negative except as noted in the HPI. Denies N/V/F/Ch.  Past Medical History:  Diagnosis Date   Complication of anesthesia    Ectopic pregnancy    Fibroids    uterine    GERD (gastroesophageal reflux disease)    uses OTC meds prn   Headache    Mass    on neck and chest   Obesity    Pneumonia 2 years ago    PONV (postoperative nausea and vomiting)    "severe" PONV ; does well with "patch and IV antinausea"     Current Outpatient Medications:    cetirizine (ZYRTEC) 10 MG tablet, Take 10 mg by mouth daily., Disp: , Rfl:    estradiol (VIVELLE-DOT) 0.05 MG/24HR patch, Place 1 patch (0.05 mg total) onto the skin 2 (two) times a week., Disp: 24 patch, Rfl: 4   fexofenadine (ALLEGRA) 180 MG tablet, fexofenadine 180 mg tablet  TAKE 1 TABLET BY MOUTH EVERY DAY, Disp: , Rfl:    hydrOXYzine (ATARAX/VISTARIL) 25 MG tablet, hydroxyzine HCl 25 mg tablet  TAKE 1 TABLET BY MOUTH THREE TIMES A DAY AS NEEDED FOR 10 DAYS, Disp: , Rfl:    loratadine (CLARITIN) 10 MG tablet, loratadine 10 mg tablet  TAKE 1 TABLET BY MOUTH EVERY DAY, Disp: , Rfl:    topiramate (TOPAMAX) 100 MG tablet, Take 1 tablet (100 mg total) by mouth 2 (two) times daily., Disp: 60 tablet, Rfl: 11   triamcinolone cream (KENALOG) 0.1 %, , Disp: , Rfl:    Vitamin D, Ergocalciferol, (DRISDOL) 50000 UNITS CAPS capsule, Take 50,000 Units by mouth  every 7 (seven) days. Sunday, Disp: , Rfl: 3  Social History   Tobacco Use  Smoking Status Never  Smokeless Tobacco Never    Allergies  Allergen Reactions   Other     Pt does not want to receive any BLOOD    Objective:  There were no vitals filed for this visit. There is no height or weight on file to calculate BMI. Constitutional Well developed. Well nourished.  Vascular Dorsalis pedis pulses palpable bilaterally. Posterior tibial pulses palpable bilaterally. Capillary refill normal to all digits.  No cyanosis or clubbing noted. Pedal hair growth normal.  Neurologic Normal speech. Oriented to person, place, and time. Epicritic sensation to light touch grossly present bilaterally.  Dermatologic Nails well groomed and normal in appearance. No open wounds. No skin lesions.  Orthopedic: Pain on palpation to the left fourth digit.  Pain with range of motion of the PIPJ joint.  No pain at the metatarsophalangeal joint.  No pain at any of the other digits.  No ecchymosis noted.   Radiographs: 3 views of skeletally mature adult left foot: No fractures noted.  No bony abnormalities identified.  Hammertoe contractures noted of 2 through 5.  Flatfoot deformities noted Assessment:   1. Left foot pain   2. Contusion of fourth toe of left foot, initial encounter    Plan:  Patient was evaluated and treated and all questions answered.  Left fourth digit toe contusion -I explained to the patient the etiology of contusion various treatment options were discussed.  Given the amount of pain that she is having I believe she will benefit from a surgical shoe to take the pressure off of the fourth digit and allow the soft tissue to heal.  I discussed with the patient can take 4 to 6 weeks to completely heal.  Patient states understanding.  At this time we will clinically monitor with her pain if she is pain-free and 4 weeks I will transition her to regular shoes.  She states understanding  No  follow-ups on file.

## 2021-08-31 ENCOUNTER — Other Ambulatory Visit: Payer: Self-pay

## 2021-08-31 ENCOUNTER — Ambulatory Visit (INDEPENDENT_AMBULATORY_CARE_PROVIDER_SITE_OTHER): Payer: 59 | Admitting: Podiatry

## 2021-08-31 DIAGNOSIS — M7672 Peroneal tendinitis, left leg: Secondary | ICD-10-CM | POA: Diagnosis not present

## 2021-09-04 ENCOUNTER — Encounter: Payer: Self-pay | Admitting: Podiatry

## 2021-09-04 NOTE — Progress Notes (Signed)
Subjective:  Patient ID: Stefanie Baldwin, female    DOB: Aug 08, 1971,  MRN: [id number]  Chief Complaint  Patient presents with   Foot Pain    Left foot pain on the side of foot     50 y.o. female presents with the above complaint.  Patient presents with follow-up of left fourth digit fracture.  She states that she is doing a lot better no pain with that.  She has secondary complaint of left peroneal tendon pain on the lateral side of the foot.  She states it came out of nowhere has progressed to gotten worse pain with ambulation.  She would like to discuss treatment options for that.  She has not seen anyone else prior to seeing me.  Review of Systems: Negative except as noted in the HPI. Denies N/V/F/Ch.  Past Medical History:  Diagnosis Date   Complication of anesthesia    Ectopic pregnancy    Fibroids    uterine    GERD (gastroesophageal reflux disease)    uses OTC meds prn   Headache    Mass    on neck and chest   Obesity    Pneumonia 2 years ago    PONV (postoperative nausea and vomiting)    "severe" PONV ; does well with "patch and IV antinausea"     Current Outpatient Medications:    cetirizine (ZYRTEC) 10 MG tablet, Take 10 mg by mouth daily., Disp: , Rfl:    estradiol (VIVELLE-DOT) 0.05 MG/24HR patch, Place 1 patch (0.05 mg total) onto the skin 2 (two) times a week., Disp: 24 patch, Rfl: 4   fexofenadine (ALLEGRA) 180 MG tablet, fexofenadine 180 mg tablet  TAKE 1 TABLET BY MOUTH EVERY DAY, Disp: , Rfl:    hydrOXYzine (ATARAX/VISTARIL) 25 MG tablet, hydroxyzine HCl 25 mg tablet  TAKE 1 TABLET BY MOUTH THREE TIMES A DAY AS NEEDED FOR 10 DAYS, Disp: , Rfl:    loratadine (CLARITIN) 10 MG tablet, loratadine 10 mg tablet  TAKE 1 TABLET BY MOUTH EVERY DAY, Disp: , Rfl:    topiramate (TOPAMAX) 100 MG tablet, Take 1 tablet (100 mg total) by mouth 2 (two) times daily., Disp: 60 tablet, Rfl: 11   triamcinolone cream (KENALOG) 0.1 %, , Disp: , Rfl:    Vitamin D, Ergocalciferol,  (DRISDOL) 50000 UNITS CAPS capsule, Take 50,000 Units by mouth every 7 (seven) days. Sunday, Disp: , Rfl: 3  Social History   Tobacco Use  Smoking Status Never  Smokeless Tobacco Never    Allergies  Allergen Reactions   Other     Pt does not want to receive any BLOOD    Objective:  There were no vitals filed for this visit. There is no height or weight on file to calculate BMI. Constitutional Well developed. Well nourished.  Vascular Dorsalis pedis pulses palpable bilaterally. Posterior tibial pulses palpable bilaterally. Capillary refill normal to all digits.  No cyanosis or clubbing noted. Pedal hair growth normal.  Neurologic Normal speech. Oriented to person, place, and time. Epicritic sensation to light touch grossly present bilaterally.  Dermatologic Nails well groomed and normal in appearance. No open wounds. No skin lesions.  Orthopedic: No further pain on palpation to the left fourth digit.  No further pain with range of motion of the PIPJ joint.  No pain at the metatarsophalangeal joint.  No pain at any of the other digits.  No ecchymosis noted.  Pain on palpation to the left peroneal tendon pain along the course of the  tendon.  Pain with dorsiflexion eversion of the foot with resistance.  No pain with plantarflexion inversion of the foot.  No pain at the ankle joint.  No pain at the posterior tibial tendon Achilles tendon ATFL ligament.   Radiographs: 3 views of skeletally mature adult left foot: No fractures noted.  No bony abnormalities identified.  Hammertoe contractures noted of 2 through 5.  Flatfoot deformities noted Assessment:   1. Peroneal tendinitis, left     Plan:  Patient was evaluated and treated and all questions answered.  Left fourth digit toe contusion -Clinically healed.  At this time no acute pain.  Patient has transition to regular shoes  Left peroneal tendinitis -I explained the patient the etiology of tendinitis and various treatment  options were extensively discussed.  Given the amount of pain that she is having I believe she will benefit from immobilization of the peroneal tendon given the amount of pain that she is having.  Patient agrees with the plan would like to proceed with cam boot immobilization. -Cam boot was dispensed  No follow-ups on file.

## 2021-10-05 ENCOUNTER — Ambulatory Visit: Payer: 59 | Admitting: Podiatry

## 2021-10-05 ENCOUNTER — Other Ambulatory Visit: Payer: Self-pay

## 2021-10-05 DIAGNOSIS — M7672 Peroneal tendinitis, left leg: Secondary | ICD-10-CM

## 2021-10-10 ENCOUNTER — Other Ambulatory Visit: Payer: Self-pay | Admitting: Podiatry

## 2021-10-10 ENCOUNTER — Telehealth: Payer: Self-pay | Admitting: *Deleted

## 2021-10-10 DIAGNOSIS — M7672 Peroneal tendinitis, left leg: Secondary | ICD-10-CM

## 2021-10-10 NOTE — Telephone Encounter (Signed)
Patient is calling because her foot is not better after the injection, requesting an MRI. Please advise.

## 2021-10-11 ENCOUNTER — Encounter: Payer: Self-pay | Admitting: Podiatry

## 2021-10-11 NOTE — Progress Notes (Signed)
Subjective:  Patient ID: Stefanie Baldwin, female    DOB: 1970/12/24,  MRN: [id number]  Chief Complaint  Patient presents with   Foot Pain    PT stated that she is still having some issues with her foot     50 y.o. female presents with the above complaint.  Patient presents with follow-up of left fourth digit fracture.  She states the pain came back and is starting to be more painful.  Hurts with ambulation.  The boot helps but still has a lot of pain.  She denies any other acute complaints she would like to discuss other treatment options  Review of Systems: Negative except as noted in the HPI. Denies N/V/F/Ch.  Past Medical History:  Diagnosis Date   Complication of anesthesia    Ectopic pregnancy    Fibroids    uterine    GERD (gastroesophageal reflux disease)    uses OTC meds prn   Headache    Mass    on neck and chest   Obesity    Pneumonia 2 years ago    PONV (postoperative nausea and vomiting)    "severe" PONV ; does well with "patch and IV antinausea"     Current Outpatient Medications:    cetirizine (ZYRTEC) 10 MG tablet, Take 10 mg by mouth daily., Disp: , Rfl:    estradiol (VIVELLE-DOT) 0.05 MG/24HR patch, Place 1 patch (0.05 mg total) onto the skin 2 (two) times a week., Disp: 24 patch, Rfl: 4   fexofenadine (ALLEGRA) 180 MG tablet, fexofenadine 180 mg tablet  TAKE 1 TABLET BY MOUTH EVERY DAY, Disp: , Rfl:    hydrOXYzine (ATARAX/VISTARIL) 25 MG tablet, hydroxyzine HCl 25 mg tablet  TAKE 1 TABLET BY MOUTH THREE TIMES A DAY AS NEEDED FOR 10 DAYS, Disp: , Rfl:    loratadine (CLARITIN) 10 MG tablet, loratadine 10 mg tablet  TAKE 1 TABLET BY MOUTH EVERY DAY, Disp: , Rfl:    topiramate (TOPAMAX) 100 MG tablet, Take 1 tablet (100 mg total) by mouth 2 (two) times daily., Disp: 60 tablet, Rfl: 11   triamcinolone cream (KENALOG) 0.1 %, , Disp: , Rfl:    Vitamin D, Ergocalciferol, (DRISDOL) 50000 UNITS CAPS capsule, Take 50,000 Units by mouth every 7 (seven) days. Sunday, Disp: ,  Rfl: 3  Social History   Tobacco Use  Smoking Status Never  Smokeless Tobacco Never    Allergies  Allergen Reactions   Other     Pt does not want to receive any BLOOD    Objective:  There were no vitals filed for this visit. There is no height or weight on file to calculate BMI. Constitutional Well developed. Well nourished.  Vascular Dorsalis pedis pulses palpable bilaterally. Posterior tibial pulses palpable bilaterally. Capillary refill normal to all digits.  No cyanosis or clubbing noted. Pedal hair growth normal.  Neurologic Normal speech. Oriented to person, place, and time. Epicritic sensation to light touch grossly present bilaterally.  Dermatologic Nails well groomed and normal in appearance. No open wounds. No skin lesions.  Orthopedic: No further pain on palpation to the left fourth digit.  No further pain with range of motion of the PIPJ joint.  No pain at the metatarsophalangeal joint.  No pain at any of the other digits.  No ecchymosis noted.  Pain on palpation to the left peroneal tendon pain along the course of the tendon.  Pain with dorsiflexion eversion of the foot with resistance.  No pain with plantarflexion inversion of the foot.  No pain at the ankle joint.  No pain at the posterior tibial tendon Achilles tendon ATFL ligament.   Radiographs: 3 views of skeletally mature adult left foot: No fractures noted.  No bony abnormalities identified.  Hammertoe contractures noted of 2 through 5.  Flatfoot deformities noted Assessment:   1. Peroneal tendinitis, left      Plan:  Patient was evaluated and treated and all questions answered.  Left fourth digit toe contusion -Clinically healed.  At this time no acute pain.  Patient has transition to regular shoes  Left peroneal tendinitis -I explained the patient the etiology of tendinitis and various treatment options were extensively discussed.  -Continue cam boot immobilization -Given the amount of pain that  she is having I believe she will benefit from a steroid injection to help decrease acute inflammatory component associate with pain.  I discussed with the patient that this is near the end tendon and there is a risk of rupture associated with it.  She understands the risk and would like to proceed despite it. -A steroid injection was performed at left lateral foot using 1% plain Lidocaine and 10 mg of Kenalog. This was well tolerated. -If no improvement we will discuss MRI evaluation during next visit   No follow-ups on file.

## 2021-10-11 NOTE — Telephone Encounter (Signed)
Notified patient.

## 2021-10-22 ENCOUNTER — Ambulatory Visit
Admission: RE | Admit: 2021-10-22 | Discharge: 2021-10-22 | Disposition: A | Discharge status: Home / Self Care | Payer: 59 | Source: Ambulatory Visit | Attending: Podiatry | Admitting: Podiatry

## 2021-10-22 ENCOUNTER — Other Ambulatory Visit: Payer: Self-pay

## 2021-10-22 DIAGNOSIS — M7672 Peroneal tendinitis, left leg: Secondary | ICD-10-CM

## 2021-10-26 ENCOUNTER — Telehealth: Payer: Self-pay | Admitting: Podiatry

## 2021-10-26 NOTE — Telephone Encounter (Signed)
Patient would like a call back about her MRI results. She read it just need to understand it better

## 2021-11-06 ENCOUNTER — Other Ambulatory Visit: Payer: Self-pay | Admitting: Obstetrics & Gynecology

## 2021-11-06 DIAGNOSIS — Z1231 Encounter for screening mammogram for malignant neoplasm of breast: Secondary | ICD-10-CM

## 2021-11-07 ENCOUNTER — Ambulatory Visit
Admission: RE | Admit: 2021-11-07 | Discharge: 2021-11-07 | Disposition: A | Discharge status: Home / Self Care | Payer: 59 | Source: Ambulatory Visit | Attending: Obstetrics & Gynecology | Admitting: Obstetrics & Gynecology

## 2021-11-07 DIAGNOSIS — Z1231 Encounter for screening mammogram for malignant neoplasm of breast: Secondary | ICD-10-CM

## 2022-03-13 ENCOUNTER — Ambulatory Visit: Payer: 59 | Admitting: Podiatry

## 2022-03-13 DIAGNOSIS — M7672 Peroneal tendinitis, left leg: Secondary | ICD-10-CM

## 2022-03-19 NOTE — Progress Notes (Signed)
?Subjective:  ?Patient ID: Stefanie Baldwin, female    DOB: 1971-07-16,  MRN: [id number] ? ?Chief Complaint  ?Patient presents with  ? Foot Pain  ? ? ?51 y.o. female presents with the above complaint.  Patient presents with follow-up of left fourth digit fracture.  She states the pain came back and is starting to be more painful.  Hurts with ambulation.  The boot helps but still has a lot of pain.  She denies any other acute complaints she would like to discuss other treatment options ? ?Review of Systems: Negative except as noted in the HPI. Denies N/V/F/Ch. ? ?Past Medical History:  ?Diagnosis Date  ? Complication of anesthesia   ? Ectopic pregnancy   ? Fibroids   ? uterine   ? GERD (gastroesophageal reflux disease)   ? uses OTC meds prn  ? Headache   ? Mass   ? on neck and chest  ? Obesity   ? Pneumonia 2 years ago   ? PONV (postoperative nausea and vomiting)   ? "severe" PONV ; does well with "patch and IV antinausea"   ? ? ?Current Outpatient Medications:  ?  cetirizine (ZYRTEC) 10 MG tablet, Take 10 mg by mouth daily., Disp: , Rfl:  ?  estradiol (VIVELLE-DOT) 0.05 MG/24HR patch, Place 1 patch (0.05 mg total) onto the skin 2 (two) times a week., Disp: 24 patch, Rfl: 4 ?  fexofenadine (ALLEGRA) 180 MG tablet, fexofenadine 180 mg tablet  TAKE 1 TABLET BY MOUTH EVERY DAY, Disp: , Rfl:  ?  hydrOXYzine (ATARAX/VISTARIL) 25 MG tablet, hydroxyzine HCl 25 mg tablet  TAKE 1 TABLET BY MOUTH THREE TIMES A DAY AS NEEDED FOR 10 DAYS, Disp: , Rfl:  ?  loratadine (CLARITIN) 10 MG tablet, loratadine 10 mg tablet  TAKE 1 TABLET BY MOUTH EVERY DAY, Disp: , Rfl:  ?  topiramate (TOPAMAX) 100 MG tablet, Take 1 tablet (100 mg total) by mouth 2 (two) times daily., Disp: 60 tablet, Rfl: 11 ?  triamcinolone cream (KENALOG) 0.1 %, , Disp: , Rfl:  ?  Vitamin D, Ergocalciferol, (DRISDOL) 50000 UNITS CAPS capsule, Take 50,000 Units by mouth every 7 (seven) days. Sunday, Disp: , Rfl: 3 ? ?Social History  ? ?Tobacco Use  ?Smoking Status Never   ?Smokeless Tobacco Never  ? ? ?Allergies  ?Allergen Reactions  ? Other   ?  Pt does not want to receive any BLOOD   ? ?Objective:  ?There were no vitals filed for this visit. ?There is no height or weight on file to calculate BMI. ?Constitutional Well developed. ?Well nourished.  ?Vascular Dorsalis pedis pulses palpable bilaterally. ?Posterior tibial pulses palpable bilaterally. ?Capillary refill normal to all digits.  ?No cyanosis or clubbing noted. ?Pedal hair growth normal.  ?Neurologic Normal speech. ?Oriented to person, place, and time. ?Epicritic sensation to light touch grossly present bilaterally.  ?Dermatologic Nails well groomed and normal in appearance. ?No open wounds. ?No skin lesions.  ?Orthopedic: No further pain on palpation to the left fourth digit.  No further pain with range of motion of the PIPJ joint.  No pain at the metatarsophalangeal joint.  No pain at any of the other digits.  No ecchymosis noted. ? ?Pain on palpation to the left peroneal tendon pain along the course of the tendon.  Pain with dorsiflexion eversion of the foot with resistance.  No pain with plantarflexion inversion of the foot.  No pain at the ankle joint.  No pain at the posterior tibial tendon Achilles  tendon ATFL ligament.  ? ?Radiographs: 3 views of skeletally mature adult left foot: No fractures noted.  No bony abnormalities identified.  Hammertoe contractures noted of 2 through 5.  Flatfoot deformities noted ? ?1. Mild peroneus longus tendinopathy just distal to the peroneal ?tubercle. ?2. Mild distal tibialis posterior tenosynovitis and tendinopathy, ?correlate clinically in assessing for tibialis posterior ?dysfunction. There is also some flattening of the normal ?longitudinal arch of the foot and pes planus is not excluded. ?3. Mild plantar fasciitis involving the medial band. ?  ?Assessment:  ? ?1. Peroneal tendinitis, left   ? ? ? ? ?Plan:  ?Patient was evaluated and treated and all questions answered. ? ?Left  fourth digit toe contusion ?-Clinically healed.  At this time no acute pain.  Patient has transition to regular shoes ? ?Left peroneal tendinitis ?-I explained the patient the etiology of tendinitis and various treatment options were extensively discussed.   ?-Continue cam boot immobilization ?-MRI was reviewed with the patient which showsMild peroneus longus tendinopathy just distal to the peroneal tubercle ?-I discussed with the patient that she would benefit from surgical intervention given that her pain has not improved.  At this time she would like to discuss surgery with her family and will return to me when she is ready to undergo it. ? ? ?No follow-ups on file.  ? ?

## 2022-08-26 ENCOUNTER — Telehealth: Payer: Self-pay | Admitting: *Deleted

## 2022-08-26 NOTE — Telephone Encounter (Signed)
Patient is calling for the soonest appointment available, "foot is killing me"

## 2022-08-30 NOTE — Telephone Encounter (Signed)
Patient called she wants a message sent to Dr Posey Pronto, her foot is killing her and she wants to be squeezed in for an earlier appointment.   Patient states she left a message for the nurse and has not received a response back.   Patient is on the waitlist for an earlier appt.

## 2022-09-04 ENCOUNTER — Telehealth: Payer: Self-pay | Admitting: Podiatry

## 2022-09-04 ENCOUNTER — Ambulatory Visit: Payer: 59 | Admitting: Podiatry

## 2022-09-04 DIAGNOSIS — M7672 Peroneal tendinitis, left leg: Secondary | ICD-10-CM | POA: Diagnosis not present

## 2022-09-04 DIAGNOSIS — Z01818 Encounter for other preprocedural examination: Secondary | ICD-10-CM | POA: Diagnosis not present

## 2022-09-04 NOTE — Telephone Encounter (Signed)
Pt called about medication that was to be sent to pharmacy.  Ibuprofen  [id number]- [place], [place] - [address] Please advise

## 2022-09-04 NOTE — Progress Notes (Signed)
Subjective:  Patient ID: Stefanie Baldwin, female    DOB: [birth year],  MRN: [id number]  Chief Complaint  Patient presents with   Foot Pain    Pt stated that she is having some discomfort with her left foot     51 y.o. female presents with the above complaint.  Patient present with left peroneal tendinitis.  She states that she is still having pain has not gotten better she has failed all conservative treatment options at this time she would like to discuss surgical options  Review of Systems: Negative except as noted in the HPI. Denies N/V/F/Ch.  Past Medical History:  Diagnosis Date   Complication of anesthesia    Ectopic pregnancy    Fibroids    uterine    GERD (gastroesophageal reflux disease)    uses OTC meds prn   Headache    Mass    on neck and chest   Obesity    Pneumonia 2 years ago    PONV (postoperative nausea and vomiting)    "severe" PONV ; does well with "patch and IV antinausea"     Current Outpatient Medications:    cetirizine (ZYRTEC) 10 MG tablet, Take 10 mg by mouth daily., Disp: , Rfl:    estradiol (VIVELLE-DOT) 0.05 MG/24HR patch, Place 1 patch (0.05 mg total) onto the skin 2 (two) times a week., Disp: 24 patch, Rfl: 4   fexofenadine (ALLEGRA) 180 MG tablet, fexofenadine 180 mg tablet  TAKE 1 TABLET BY MOUTH EVERY DAY, Disp: , Rfl:    hydrOXYzine (ATARAX/VISTARIL) 25 MG tablet, hydroxyzine HCl 25 mg tablet  TAKE 1 TABLET BY MOUTH THREE TIMES A DAY AS NEEDED FOR 10 DAYS, Disp: , Rfl:    ibuprofen (ADVIL) 800 MG tablet, Take 1 tablet (800 mg total) by mouth every 6 (six) hours as needed., Disp: 60 tablet, Rfl: 1   loratadine (CLARITIN) 10 MG tablet, loratadine 10 mg tablet  TAKE 1 TABLET BY MOUTH EVERY DAY, Disp: , Rfl:    topiramate (TOPAMAX) 100 MG tablet, Take 1 tablet (100 mg total) by mouth 2 (two) times daily., Disp: 60 tablet, Rfl: 11   triamcinolone cream (KENALOG) 0.1 %, , Disp: , Rfl:    Vitamin D, Ergocalciferol, (DRISDOL) 50000 UNITS CAPS capsule,  Take 50,000 Units by mouth every 7 (seven) days. Sunday, Disp: , Rfl: 3  Social History   Tobacco Use  Smoking Status Never  Smokeless Tobacco Never    Allergies  Allergen Reactions   Other     Pt does not want to receive any BLOOD    Objective:  There were no vitals filed for this visit. There is no height or weight on file to calculate BMI. Constitutional Well developed. Well nourished.  Vascular Dorsalis pedis pulses palpable bilaterally. Posterior tibial pulses palpable bilaterally. Capillary refill normal to all digits.  No cyanosis or clubbing noted. Pedal hair growth normal.  Neurologic Normal speech. Oriented to person, place, and time. Epicritic sensation to light touch grossly present bilaterally.  Dermatologic Nails well groomed and normal in appearance. No open wounds. No skin lesions.  Orthopedic: No further pain on palpation to the left fourth digit.  No further pain with range of motion of the PIPJ joint.  No pain at the metatarsophalangeal joint.  No pain at any of the other digits.  No ecchymosis noted.  Pain on palpation to the left peroneal tendon pain along the course of the tendon.  Pain with dorsiflexion eversion of the foot with resistance.  No pain with plantarflexion inversion of the foot.  No pain at the ankle joint.  No pain at the posterior tibial tendon Achilles tendon ATFL ligament.   Radiographs: 3 views of skeletally mature adult left foot: No fractures noted.  No bony abnormalities identified.  Hammertoe contractures noted of 2 through 5.  Flatfoot deformities noted  1. Mild peroneus longus tendinopathy just distal to the peroneal tubercle. 2. Mild distal tibialis posterior tenosynovitis and tendinopathy, correlate clinically in assessing for tibialis posterior dysfunction. There is also some flattening of the normal longitudinal arch of the foot and pes planus is not excluded. 3. Mild plantar fasciitis involving the medial band.    Assessment:   1. Peroneal tendinitis, left   2. Encounter for preoperative examination for general surgical procedure        Plan:  Patient was evaluated and treated and all questions answered.  Left fourth digit toe contusion -Clinically healed.  At this time no acute pain.  Patient has transition to regular shoes  Left peroneal tendinitis -I explained the patient the etiology of tendinitis and various treatment options were extensively discussed.   -Continue cam boot immobilization -MRI was reviewed and discussed with the patient extensive detail.  Given the it shows mild peroneal tendinopathy I believe is a little bit more moderate and involved in that.  I discussed with her she will benefit from surgical exploration and if there is any tearing we will plan on repairing the tendon as well.  I discussed with patient extensive detail I discussed my preoperative intra postop plan as well.  She will be nonweightbearing for 4 to 6 weeks.  I discussed post sports surgical procedures as well.  It she states understanding and would like to proceed with surgery -Informed surgical risk consent was reviewed and read aloud to the patient.  I reviewed the films.  I have discussed my findings with the patient in great detail.  I have discussed all risks including but not limited to infection, stiffness, scarring, limp, disability, deformity, damage to blood vessels and nerves, numbness, poor healing, need for braces, arthritis, chronic pain, amputation, death.  All benefits and realistic expectations discussed in great detail.  I have made no promises as to the outcome.  I have provided realistic expectations.  I have offered the patient a 2nd opinion, which they have declined and assured me they preferred to proceed despite the risks    No follow-ups on file.   Left peroneal tendon repair surgery

## 2022-09-05 MED ORDER — IBUPROFEN 800 MG PO TABS: 800.0000 mg | ORAL_TABLET | Freq: Four times a day (QID) | ORAL | 1 refills | Status: DC | PRN

## 2022-09-05 NOTE — Addendum Note (Signed)
Addended by: Boneta Lucks on: 09/05/2022 06:56 AM   Modules accepted: Orders

## 2022-09-13 ENCOUNTER — Ambulatory Visit: Payer: 59 | Admitting: Podiatry

## 2022-09-18 ENCOUNTER — Institutional Professional Consult (permissible substitution): Payer: 59 | Admitting: Plastic Surgery

## 2022-12-03 ENCOUNTER — Encounter: Payer: Self-pay | Admitting: Plastic Surgery

## 2022-12-03 ENCOUNTER — Ambulatory Visit: Payer: 59 | Admitting: Plastic Surgery

## 2022-12-03 VITALS — BP 141/92 | HR 116 | Ht 65.0 in | Wt 240.8 lb

## 2022-12-03 DIAGNOSIS — Z6839 Body mass index (BMI) 39.0-39.9, adult: Secondary | ICD-10-CM

## 2022-12-03 DIAGNOSIS — M542 Cervicalgia: Secondary | ICD-10-CM | POA: Diagnosis not present

## 2022-12-03 DIAGNOSIS — R21 Rash and other nonspecific skin eruption: Secondary | ICD-10-CM

## 2022-12-03 DIAGNOSIS — M545 Low back pain, unspecified: Secondary | ICD-10-CM | POA: Diagnosis not present

## 2022-12-03 DIAGNOSIS — G8929 Other chronic pain: Secondary | ICD-10-CM

## 2022-12-03 DIAGNOSIS — M549 Dorsalgia, unspecified: Secondary | ICD-10-CM | POA: Insufficient documentation

## 2022-12-03 DIAGNOSIS — M546 Pain in thoracic spine: Secondary | ICD-10-CM

## 2022-12-03 DIAGNOSIS — N62 Hypertrophy of breast: Secondary | ICD-10-CM

## 2022-12-03 NOTE — Progress Notes (Signed)
Patient ID: Stefanie Baldwin, female    DOB: [birth year], 51 y.o.   MRN: [id number]   Chief Complaint  Patient presents with   Advice Only   Breast Problem    Mammary Hyperplasia: The patient is a 51 y.o. female with a history of mammary hyperplasia for several years.  She has extremely large breasts causing symptoms that include the following: Back pain in the upper and lower back, including neck pain. She pulls or pins her bra straps to provide better lift and relief of the pressure and pain. She notices relief by holding her breast up manually.  Her shoulder straps cause grooves and pain and pressure that requires padding for relief. Pain medication is sometimes required with motrin and tylenol.  Activities that are hindered by enlarged breasts include: exercise and running.  She has tried supportive clothing as well as fitted bras without improvement.  Her breasts are extremely large and fairly symmetric.  She has hyperpigmentation of the inframammary area on both sides.  The sternal to nipple distance on the right is 22 cm and the left is 22 cm.  The IMF distance is 14 cm.  She is 5 feet 5 inches tall and weighs 240 pounds.  The BMI = 39.9 kg/m.  Preoperative bra size = DDD cup.   The estimated excess breast tissue to be removed at the time of surgery = 700 grams on the left and 700 grams on the right.  Mammogram history: 10/2021 negative.  Family history of breast cancer:  no.  Tobacco use:  no.   The patient expresses the desire to pursue surgical intervention. The patient underwent a bilateral breast reduction in 2001 by Dr. Towanda Malkin.  She is not sure how much she had removed at that time.  She is going to see if she can find her results.  She is due for mammogram and we will put the order in for that.    Review of Systems  Constitutional:  Positive for activity change. Negative for appetite change.  Eyes: Negative.   Respiratory: Negative.  Negative for chest tightness and shortness of  breath.   Cardiovascular: Negative.  Negative for leg swelling.  Endocrine: Negative.   Genitourinary: Negative.   Musculoskeletal:  Positive for back pain and neck pain.  Skin:  Positive for rash.  Hematological: Negative.   Psychiatric/Behavioral: Negative.      Past Medical History:  Diagnosis Date   Complication of anesthesia    Ectopic pregnancy    Fibroids    uterine    GERD (gastroesophageal reflux disease)    uses OTC meds prn   Headache    Mass    on neck and chest   Obesity    Pneumonia 2 years ago    PONV (postoperative nausea and vomiting)    "severe" PONV ; does well with "patch and IV antinausea"     Past Surgical History:  Procedure Laterality Date   CYST REMOVAL HAND     wrist   EXCISION MASS NECK N/A 12/01/2017   Procedure: EXCISION MASS POSTERIOR NECK;  Surgeon: Cristine Polio, MD;  Location: [place];  Service: Plastics;  Laterality: N/A;   FOOT SURGERY     LIPOSUCTION N/A 12/01/2017   Procedure: LIPOSUCTION;  Surgeon: Cristine Polio, MD;  Location: [place];  Service: Plastics;  Laterality: N/A;   OVARIAN CYST REMOVAL     REDUCTION MAMMAPLASTY Bilateral    ROBOTIC ASSISTED LAPAROSCOPIC HYSTERECTOMY AND  SALPINGECTOMY Bilateral 10/06/2018   Procedure: XI ROBOTIC ASSISTED TOTAL LAPAROSCOPIC HYSTERECTOMY AND SALPINGECTOMY;  Surgeon: [name], MD;  Location: [facility];  Service: Gynecology;  Laterality: Bilateral;   ROBOTIC ASSISTED LAPAROSCOPIC LYSIS OF ADHESION  10/06/2018   Procedure: XI ROBOTIC ASSISTED LAPAROSCOPIC LYSIS OF ADHESION;  Surgeon: [name], MD;  Location: [facility];  Service: Gynecology;;      Current Outpatient Medications:    cetirizine (ZYRTEC) 5 MG tablet, Take 5 mg by mouth daily., Disp: , Rfl:    topiramate (TOPAMAX) 100 MG tablet, Take 1 tablet (100 mg total) by mouth 2 (two) times daily., Disp: 60 tablet, Rfl: 11   Vitamin D, Ergocalciferol, (DRISDOL) 50000 UNITS CAPS capsule,  Take 50,000 Units by mouth every 7 (seven) days. Sunday, Disp: , Rfl: 3   Objective:   Vitals:   12/03/22 1312  BP: (!) 141/92  Pulse: (!) 116  SpO2: 99%    Physical Exam Vitals and nursing note reviewed.  Constitutional:      Appearance: Normal appearance.  HENT:     Head: Normocephalic and atraumatic.  Cardiovascular:     Rate and Rhythm: Normal rate.     Pulses: Normal pulses.  Pulmonary:     Effort: Pulmonary effort is normal.  Abdominal:     General: There is no distension.     Palpations: Abdomen is soft.  Skin:    General: Skin is warm.     Capillary Refill: Capillary refill takes less than 2 seconds.     Coloration: Skin is not jaundiced.     Findings: No bruising.  Neurological:     Mental Status: She is alert and oriented to person, place, and time.  Psychiatric:        Mood and Affect: Mood normal.        Behavior: Behavior normal.        Thought Content: Thought content normal.        Judgment: Judgment normal.     Assessment & Plan:  Chronic bilateral thoracic back pain  Neck pain  Symptomatic mammary hypertrophy  The procedure the patient selected and that was best for the patient was discussed. The risk were discussed and include but not limited to the following:  Breast asymmetry, fluid accumulation, firmness of the breast, inability to breast feed, loss of nipple or areola, skin loss, change in skin and nipple sensation, fat necrosis of the breast tissue, bleeding, infection and healing delay.  There are risks of anesthesia and injury to nerves or blood vessels.  Allergic reaction to tape, suture and skin glue are possible.  There will be swelling.  Any of these can lead to the need for revisional surgery.  A breast reduction has potential to interfere with diagnostic procedures in the future.  This procedure is best done when the breast is fully developed.  Changes in the breast will continue to occur over time: pregnancy, weight gain or weigh loss.     Total time: 40 minutes. This includes time spent with the patient during the visit as well as time spent before and after the visit reviewing the chart, documenting the encounter, ordering pertinent studies and literature for the patient.   Physical therapy: Ordered Mammogram: Ordered  Patient is a good candidate for bilateral breast reduction revision.  I am assuming she had most likely had an inferior pedicle technique.  I found Dr. Neita Goodnight operative note from May 07, 2011.  The amount of volume that was removed is not noted  in the op note.  He states that a inferior pedicle was used I can see his after she finishes physical therapy and we will reevaluate her at that time.  Plan for bilateral breast reduction with possible liposuction laterally.  Pictures were obtained of the patient and placed in the chart with the patient's or guardian's permission.   [address], DO

## 2022-12-06 ENCOUNTER — Other Ambulatory Visit: Payer: Self-pay | Admitting: Plastic Surgery

## 2022-12-06 ENCOUNTER — Ambulatory Visit
Admission: RE | Admit: 2022-12-06 | Discharge: 2022-12-06 | Disposition: A | Discharge status: Home / Self Care | Payer: 59 | Source: Ambulatory Visit | Attending: Plastic Surgery | Admitting: Plastic Surgery

## 2022-12-06 DIAGNOSIS — M542 Cervicalgia: Secondary | ICD-10-CM

## 2022-12-06 DIAGNOSIS — N62 Hypertrophy of breast: Secondary | ICD-10-CM

## 2022-12-06 DIAGNOSIS — G8929 Other chronic pain: Secondary | ICD-10-CM

## 2022-12-19 ENCOUNTER — Other Ambulatory Visit: Payer: Self-pay

## 2022-12-19 ENCOUNTER — Ambulatory Visit: Payer: 59 | Attending: Plastic Surgery | Admitting: Physical Therapy

## 2022-12-19 DIAGNOSIS — M546 Pain in thoracic spine: Secondary | ICD-10-CM | POA: Diagnosis present

## 2022-12-19 DIAGNOSIS — G8929 Other chronic pain: Secondary | ICD-10-CM | POA: Insufficient documentation

## 2022-12-19 DIAGNOSIS — M542 Cervicalgia: Secondary | ICD-10-CM | POA: Insufficient documentation

## 2022-12-19 DIAGNOSIS — M6281 Muscle weakness (generalized): Secondary | ICD-10-CM | POA: Insufficient documentation

## 2022-12-19 DIAGNOSIS — N62 Hypertrophy of breast: Secondary | ICD-10-CM | POA: Insufficient documentation

## 2022-12-19 NOTE — Therapy (Signed)
OUTPATIENT PHYSICAL THERAPY SHOULDER EVALUATION   Patient Name: Stefanie Baldwin MRN: [id number] DOB:1971/03/16, 52 y.o., female Today's Date: 12/19/2022   PT End of Session - 12/19/22 0731     Visit Number 1    Number of Visits --   1-2x/week   Date for PT Re-Evaluation 02/13/23    Authorization Type [employer]    PT Start Time 0705    PT Stop Time 0730    PT Time Calculation (min) 25 min             Past Medical History:  Diagnosis Date   Complication of anesthesia    Ectopic pregnancy    Fibroids    uterine    GERD (gastroesophageal reflux disease)    uses OTC meds prn   Headache    Mass    on neck and chest   Obesity    Pneumonia 2 years ago    PONV (postoperative nausea and vomiting)    "severe" PONV ; does well with "patch and IV antinausea"    Past Surgical History:  Procedure Laterality Date   CYST REMOVAL HAND     wrist   EXCISION MASS NECK N/A 12/01/2017   Procedure: EXCISION MASS POSTERIOR NECK;  Surgeon: Cristine Polio, MD;  Location: [place];  Service: Plastics;  Laterality: N/A;   FOOT SURGERY     LIPOSUCTION N/A 12/01/2017   Procedure: LIPOSUCTION;  Surgeon: Cristine Polio, MD;  Location: [place];  Service: Plastics;  Laterality: N/A;   OVARIAN CYST REMOVAL     REDUCTION MAMMAPLASTY Bilateral    ROBOTIC ASSISTED LAPAROSCOPIC HYSTERECTOMY AND SALPINGECTOMY Bilateral 10/06/2018   Procedure: XI ROBOTIC ASSISTED TOTAL LAPAROSCOPIC HYSTERECTOMY AND SALPINGECTOMY;  Surgeon: [name], MD;  Location: [facility];  Service: Gynecology;  Laterality: Bilateral;   ROBOTIC ASSISTED LAPAROSCOPIC LYSIS OF ADHESION  10/06/2018   Procedure: XI ROBOTIC ASSISTED LAPAROSCOPIC LYSIS OF ADHESION;  Surgeon: [name], MD;  Location: [facility];  Service: Gynecology;;   Patient Active Problem List   Diagnosis Date Noted   Back pain 12/03/2022   Neck pain 12/03/2022   Symptomatic mammary hypertrophy 12/03/2022   Acute  pansinusitis 09/07/2020   Candidal vulvovaginitis 09/07/2020   Dysuria 09/07/2020   Urine finding 09/07/2020   Chronic migraine with aura 01/18/2020   Post-operative state 10/06/2018   Eschar of toe 06/14/2015   Metatarsal deformity 06/14/2015   Discoloration of skin of foot 06/07/2014    PCP: Everardo Beals, NP  REFERRING PROVIDER: Wallace Going, DO  THERAPY DIAG:  Pain in thoracic spine  Cervicalgia  Muscle weakness   Rationale for Evaluation and Treatment Rehabilitation  Patient Name: Stefanie Baldwin MRN: [id number] DOB:08-19-1971, 52 y.o., female Today's Date: 12/19/2022   PT End of Session - 12/19/22 0731     Visit Number 1    Number of Visits --   1-2x/week   Date for PT Re-Evaluation 02/13/23    Authorization Type [employer]et[employer]    PT Start Time 0705    PT Stop Time 0730    PT Time Calculation (min) 25 min             Past Medical History:  Diagnosis Date   Complication of anesthesia    Ectopic pregnancy    Fibroids    uterine    GERD (gastroesophageal reflux disease)    uses OTC meds prn   Headache    Mass    on neck and chest   Obesity  Pneumonia 2 years ago    PONV (postoperative nausea and vomiting)    "severe" PONV ; does well with "patch and IV antinausea"    Past Surgical History:  Procedure Laterality Date   CYST REMOVAL HAND     wrist   EXCISION MASS NECK N/A 12/01/2017   Procedure: EXCISION MASS POSTERIOR NECK;  Surgeon: Cristine Polio, MD;  Location: [place];  Service: Plastics;  Laterality: N/A;   FOOT SURGERY     LIPOSUCTION N/A 12/01/2017   Procedure: LIPOSUCTION;  Surgeon: Cristine Polio, MD;  Location: [place];  Service: Plastics;  Laterality: N/A;   OVARIAN CYST REMOVAL     REDUCTION MAMMAPLASTY Bilateral    ROBOTIC ASSISTED LAPAROSCOPIC HYSTERECTOMY AND SALPINGECTOMY Bilateral 10/06/2018   Procedure: XI ROBOTIC ASSISTED TOTAL LAPAROSCOPIC HYSTERECTOMY AND SALPINGECTOMY;  Surgeon:  [name], MD;  Location: [facility];  Service: Gynecology;  Laterality: Bilateral;   ROBOTIC ASSISTED LAPAROSCOPIC LYSIS OF ADHESION  10/06/2018   Procedure: XI ROBOTIC ASSISTED LAPAROSCOPIC LYSIS OF ADHESION;  Surgeon: [name], MD;  Location: [facility];  Service: Gynecology;;   Patient Active Problem List   Diagnosis Date Noted   Back pain 12/03/2022   Neck pain 12/03/2022   Symptomatic mammary hypertrophy 12/03/2022   Acute pansinusitis 09/07/2020   Candidal vulvovaginitis 09/07/2020   Dysuria 09/07/2020   Urine finding 09/07/2020   Chronic migraine with aura 01/18/2020   Post-operative state 10/06/2018   Eschar of toe 06/14/2015   Metatarsal deformity 06/14/2015   Discoloration of skin of foot 06/07/2014    PCP: Everardo Beals, NP  REFERRING PROVIDER: Wallace Going, DO  THERAPY DIAG:  Pain in thoracic spine  Cervicalgia  Muscle weakness  REFERRING DIAG: Chronic bilateral thoracic back pain [M54.6, G89.29], Neck pain [M54.2], Symptomatic mammary hypertrophy [N62]   Rationale for Evaluation and Treatment:  Rehabilitation  SUBJECTIVE:  PERTINENT PAST HISTORY:  migraine      PRECAUTIONS: None  WEIGHT BEARING RESTRICTIONS No  FALLS:  Has patient fallen in last 6 months? No, Number of falls: 0  MOI/History of condition:  Onset date: >3 years  SUBJECTIVE STATEMENT  Stefanie Baldwin is a 52 y.o. female who presents to clinic with chief complaint of neck, thoracic, and shoulder pain which is chronic.  She feels this is related to mammary hypertrophy.  Denies n/t in arms or UE weakness.  The pain interferes with her daily life and work.   Pain:  Are you having pain? Yes Pain location: neck, upper thoracic, and shoulder pain NPRS scale:  5/10 to 9/10 Aggravating factors: sitting, standing, work Relieving factors: NSAID Pain description: constant and aching Stage: Chronic Stability: staying the same  Occupation: Environmental consultant, computer work  with lots of walking  Assistive Device: NA  Hand Dominance: R  Patient Goals/Specific Activities: NA   OBJECTIVE:    DIAGNOSTIC FINDINGS:  NA  GENERAL OBSERVATION:  Mammary hypertrophy, forward head, rounded shoulders     SENSATION:  Light touch: Appears intact   PALPATION: Bil UT, cervical paraspinals, sub occipitals  Cervical ROM  ROM ROM  12/19/2022  Flexion 40  Extension 50  Right lateral flexion 25*  Left lateral flexion 25*  Right rotation 45*  Left rotation 45*  Flexion rotation (normal is 30 degrees)   Flexion rotation (normal is 30 degrees)     (Blank rows = not tested, N = WNL, * = concordant pain)   Throacic AROM  AROM AROM  12/19/2022  Flexion limited by 50%,  w/ concordant pain  Extension limited by 75%, w/ concordant pain  Right lateral flexion   Left lateral flexion   Right rotation limited by 75%, w/ concordant pain  Left rotation limited by >75%, w/ concordant pain    (Blank rows = not tested)    UPPER EXTREMITY MMT:  MMT Right 12/19/2022 Left 12/19/2022  Shoulder flexion    Shoulder abduction (C5)    Shoulder ER    Shoulder IR    Middle trapezius 3+ 3+  Lower trapezius Unable to get into position Unable to get into position  Shoulder extension    Grip strength    Cervical flexion (C1,C2)    Cervical S/B (C3)    Shoulder shrug (C4)    Elbow flexion (C6)    Elbow ext (C7)    Thumb ext (C8)    Finger abd (T1)    Grossly     (Blank rows = not tested, score listed is out of 5 possible points.  N = WNL, D = diminished, C = clear for gross weakness with myotome testing, * = concordant pain with testing)   TODAY'S TREATMENT:  Creating, reviewing, and completing below HEP   PATIENT EDUCATION:  POC, diagnosis, prognosis, HEP.  Pt educated via explanation, demonstration, and handout (HEP).  Pt confirms understanding verbally.    HOME EXERCISE PROGRAM:  Access Code: [email or web address] URL: [email or web address] Date:  12/19/2022 Prepared by: Shearon Balo  Exercises - Seated Assisted Cervical Rotation with Towel  - 3 x daily - 7 x weekly - 2 sets - 15 reps - Sidelying Open Book  - 1 x daily - 7 x weekly - 3 sets - 10 reps - Seated Shoulder Horizontal Abduction with Resistance - Palms Down  - 1 x daily - 7 x weekly - 3 sets - 10 reps   ASSESSMENT:  CLINICAL IMPRESSION: Stefanie Baldwin is a 52 y.o. female who presents to clinic with signs and sxs consistent with neck, thoracic, and UT pain.  Pain appears mechanical.  Large myofacial contribution with increased tone in cervical and thoracic paraspinals as well bil UT.      OBJECTIVE IMPAIRMENTS: Pain, cervical ROM, thoracic ROM, periscapular strength  ACTIVITY LIMITATIONS: bending, standing, walking, working  PERSONAL FACTORS: See medical history and pertinent history   REHAB POTENTIAL: Fair chronic and severe  CLINICAL DECISION MAKING: Stable/uncomplicated  EVALUATION COMPLEXITY: Low   GOALS:  SHORT TERM GOALS: Stefanie Baldwin will be >75% HEP compliant throughout therapy to improve carryover between sessions and facilitate independent management of condition.  LONG TERM GOALS:   Stefanie Baldwin will self report >/= 50% decrease in pain from evaluation   Evaluation/Baseline (12/19/2022): 8/10 max pain Target date: 02/13/2023 Goal status: INITIAL   2.  Stefanie Baldwin will improve the following MMTs to >/= 4/5 to show improvement in strength:  mid traps   Evaluation/Baseline (12/19/2022): see chart in note Target date: 02/13/2023 Goal status: INITIAL  3.  Stefanie Baldwin will report confidence in self management of condition at time of discharge with advanced HEP  Evaluation/Baseline (12/19/2022): unable to self manage Target date: 02/13/2023 Goal status: INITIAL    PLAN: PT FREQUENCY: 1-2x/week  PT DURATION: 8 weeks (Ending 02/13/2023)  PLANNED INTERVENTIONS: Therapeutic exercises, Aquatic therapy, Therapeutic activity, Neuro Muscular re-education, Gait training,  Patient/Family education, Joint mobilization, Dry Needling, Electrical stimulation, Spinal mobilization and/or manipulation, Moist heat, Taping, Vasopneumatic device, Ionotophoresis '4mg'$ /ml Dexamethasone, and Manual therapy  PLAN FOR NEXT SESSION: Progressive throacic mobility along with postural muscle strengthening   Shearon Balo PT, DPT  12/19/2022, 7:32 AM

## 2022-12-26 ENCOUNTER — Ambulatory Visit: Payer: 59 | Admitting: Physical Therapy

## 2022-12-27 ENCOUNTER — Ambulatory Visit: Payer: 59

## 2022-12-27 DIAGNOSIS — M542 Cervicalgia: Secondary | ICD-10-CM

## 2022-12-27 DIAGNOSIS — M6281 Muscle weakness (generalized): Secondary | ICD-10-CM

## 2022-12-27 DIAGNOSIS — M546 Pain in thoracic spine: Secondary | ICD-10-CM | POA: Diagnosis not present

## 2022-12-27 NOTE — Therapy (Signed)
OUTPATIENT PHYSICAL THERAPY TREATMENT NOTE   Patient Name: Stefanie Baldwin MRN: [id number] DOB:04/26/1971, 52 y.o., female Today's Date: 12/27/2022  PCP: Everardo Beals, NP  REFERRING PROVIDER: Wallace Going, DO   END OF SESSION:   PT End of Session - 12/27/22 0744     Visit Number 2    Date for PT Re-Evaluation 02/13/23    Authorization Type Aet[employer]    PT Start Time 0745    PT Stop Time 0825    PT Time Calculation (min) 40 min    Activity Tolerance Patient tolerated treatment well    Behavior During Therapy WFL for tasks assessed/performed             Past Medical History:  Diagnosis Date   Complication of anesthesia    Ectopic pregnancy    Fibroids    uterine    GERD (gastroesophageal reflux disease)    uses OTC meds prn   Headache    Mass    on neck and chest   Obesity    Pneumonia 2 years ago    PONV (postoperative nausea and vomiting)    "severe" PONV ; does well with "patch and IV antinausea"    Past Surgical History:  Procedure Laterality Date   CYST REMOVAL HAND     wrist   EXCISION MASS NECK N/A 12/01/2017   Procedure: EXCISION MASS POSTERIOR NECK;  Surgeon: Cristine Polio, MD;  Location: [place];  Service: Plastics;  Laterality: N/A;   FOOT SURGERY     LIPOSUCTION N/A 12/01/2017   Procedure: LIPOSUCTION;  Surgeon: Cristine Polio, MD;  Location: [place];  Service: Plastics;  Laterality: N/A;   OVARIAN CYST REMOVAL     REDUCTION MAMMAPLASTY Bilateral    ROBOTIC ASSISTED LAPAROSCOPIC HYSTERECTOMY AND SALPINGECTOMY Bilateral 10/06/2018   Procedure: XI ROBOTIC ASSISTED TOTAL LAPAROSCOPIC HYSTERECTOMY AND SALPINGECTOMY;  Surgeon: [name], MD;  Location: [facility];  Service: Gynecology;  Laterality: Bilateral;   ROBOTIC ASSISTED LAPAROSCOPIC LYSIS OF ADHESION  10/06/2018   Procedure: XI ROBOTIC ASSISTED LAPAROSCOPIC LYSIS OF ADHESION;  Surgeon: [name], MD;  Location: [facility];  Service:  Gynecology;;   Patient Active Problem List   Diagnosis Date Noted   Back pain 12/03/2022   Neck pain 12/03/2022   Symptomatic mammary hypertrophy 12/03/2022   Acute pansinusitis 09/07/2020   Candidal vulvovaginitis 09/07/2020   Dysuria 09/07/2020   Urine finding 09/07/2020   Chronic migraine with aura 01/18/2020   Post-operative state 10/06/2018   Eschar of toe 06/14/2015   Metatarsal deformity 06/14/2015   Discoloration of skin of foot 06/07/2014    REFERRING DIAG: Pain in thoracic spine   Cervicalgia   Muscle weakness  THERAPY DIAG:  Pain in thoracic spine  Cervicalgia  Muscle weakness  Rationale for Evaluation and Treatment Rehabilitation  PERTINENT HISTORY: migraine   PRECAUTIONS: None   SUBJECTIVE:  SUBJECTIVE STATEMENT:  No changes to report, no concerns since IR   PAIN:  Are you having pain? Yes: NPRS scale: 7/10 Pain location: neck and upper back Pain description: ache Aggravating factors: activity Relieving factors: rest   OBJECTIVE: (objective measures completed at initial evaluation unless otherwise dated)     DIAGNOSTIC FINDINGS:  NA   GENERAL OBSERVATION:          Mammary hypertrophy, forward head, rounded shoulders                               SENSATION:          Light touch: Appears intact           PALPATION: Bil UT, cervical paraspinals, sub occipitals   Cervical ROM   ROM ROM  12/19/2022  Flexion 40  Extension 50  Right lateral flexion 25*  Left lateral flexion 25*  Right rotation 45*  Left rotation 45*  Flexion rotation (normal is 30 degrees)    Flexion rotation (normal is 30 degrees)      (Blank rows = not tested, N = WNL, * = concordant pain)     Throacic AROM   AROM AROM  12/19/2022  Flexion limited by 50%, w/ concordant pain  Extension  limited by 75%, w/ concordant pain  Right lateral flexion    Left lateral flexion    Right rotation limited by 75%, w/ concordant pain  Left rotation limited by >75%, w/ concordant pain    (Blank rows = not tested)       UPPER EXTREMITY MMT:   MMT Right 12/19/2022 Left 12/19/2022  Shoulder flexion      Shoulder abduction (C5)      Shoulder ER      Shoulder IR      Middle trapezius 3+ 3+  Lower trapezius Unable to get into position Unable to get into position  Shoulder extension      Grip strength      Cervical flexion (C1,C2)      Cervical S/B (C3)      Shoulder shrug (C4)      Elbow flexion (C6)      Elbow ext (C7)      Thumb ext (C8)      Finger abd (T1)      Grossly        (Blank rows = not tested, score listed is out of 5 possible points.  N = WNL, D = diminished, C = clear for gross weakness with myotome testing, * = concordant pain with testing)     TODAY'S TREATMENT:   [facility] Adult PT Treatment:                                                DATE: 12/27/22 Therapeutic Exercise: Nustep L2 8 min Supine flexion with stick 15x with breathing patterns emphasized Supine alternating flexion 15/15 Supine hor abd YTB 15x Open book 10/10 Seated hor abd YTB 15x UT stretch 30s  B Chin tucks using 1/2 roll 10x2 Supine cervical rotation 10x B over 1/2 roll    Creating, reviewing, and completing below HEP     PATIENT EDUCATION:  POC, diagnosis, prognosis, HEP.  Pt educated via explanation, demonstration, and handout (HEP).  Pt confirms understanding verbally.  HOME EXERCISE PROGRAM:   Access Code: [email or web address] URL: [email or web address] Date: 12/19/2022 Prepared by: Shearon Balo   Exercises - Seated Assisted Cervical Rotation with Towel  - 3 x daily - 7 x weekly - 2 sets - 15 reps - Sidelying Open Book  - 1 x daily - 7 x weekly - 3 sets - 10 reps - Seated Shoulder Horizontal Abduction with Resistance - Palms Down  - 1 x daily - 7 x weekly - 3  sets - 10 reps     ASSESSMENT:   CLINICAL IMPRESSION: First return session.  Initiated postural correction tasks, stretching and strengthening of thoracic spine. Cued for proper form with all exercises and activities    OBJECTIVE IMPAIRMENTS: Pain, cervical ROM, thoracic ROM, periscapular strength   ACTIVITY LIMITATIONS: bending, standing, walking, working   PERSONAL FACTORS: See medical history and pertinent history     REHAB POTENTIAL: Fair chronic and severe   CLINICAL DECISION MAKING: Stable/uncomplicated   EVALUATION COMPLEXITY: Low     GOALS:   SHORT TERM GOALS: Kineta will be >75% HEP compliant throughout therapy to improve carryover between sessions and facilitate independent management of condition.   LONG TERM GOALS:    Nicky will self report >/= 50% decrease in pain from evaluation    Evaluation/Baseline (12/19/2022): 8/10 max pain Target date: 02/13/2023 Goal status: INITIAL    2.  Jessicah will improve the following MMTs to >/= 4/5 to show improvement in strength:  mid traps    Evaluation/Baseline (12/19/2022): see chart in note Target date: 02/13/2023 Goal status: INITIAL   3.  Anayeli will report confidence in self management of condition at time of discharge with advanced HEP   Evaluation/Baseline (12/19/2022): unable to self manage Target date: 02/13/2023 Goal status: INITIAL       PLAN: PT FREQUENCY: 1-2x/week   PT DURATION: 8 weeks (Ending 02/13/2023)   PLANNED INTERVENTIONS: Therapeutic exercises, Aquatic therapy, Therapeutic activity, Neuro Muscular re-education, Gait training, Patient/Family education, Joint mobilization, Dry Needling, Electrical stimulation, Spinal mobilization and/or manipulation, Moist heat, Taping, Vasopneumatic device, Ionotophoresis '4mg'$ /ml Dexamethasone, and Manual therapy   PLAN FOR NEXT SESSION: Progressive throacic mobility along with postural muscle strengthening   Lanice Shirts, PT 12/27/2022, 7:45 AM

## 2023-01-03 ENCOUNTER — Encounter: Payer: Self-pay | Admitting: Physical Therapy

## 2023-01-03 ENCOUNTER — Ambulatory Visit: Payer: 59 | Admitting: Physical Therapy

## 2023-01-03 DIAGNOSIS — M546 Pain in thoracic spine: Secondary | ICD-10-CM | POA: Diagnosis not present

## 2023-01-03 DIAGNOSIS — M542 Cervicalgia: Secondary | ICD-10-CM

## 2023-01-03 DIAGNOSIS — M6281 Muscle weakness (generalized): Secondary | ICD-10-CM

## 2023-01-03 NOTE — Therapy (Signed)
OUTPATIENT PHYSICAL THERAPY TREATMENT NOTE   Patient Name: Stefanie Baldwin MRN: [id number] DOB:05-Apr-1971, 52 y.o., female Today's Date: 01/03/2023  PCP: Stefanie Beals, NP  REFERRING PROVIDER: Wallace Going, DO   END OF SESSION:   PT End of Session - 01/03/23 0705     Visit Number 3    Number of Visits --   1-2x/week   Date for PT Re-Evaluation 02/13/23    Authorization Type [employer]et[employer]    PT Start Time 0705    PT Stop Time 0745    PT Time Calculation (min) 40 min             Past Medical History:  Diagnosis Date   Complication of anesthesia    Ectopic pregnancy    Fibroids    uterine    GERD (gastroesophageal reflux disease)    uses OTC meds prn   Headache    Mass    on neck and chest   Obesity    Pneumonia 2 years ago    PONV (postoperative nausea and vomiting)    "severe" PONV ; does well with "patch and IV antinausea"    Past Surgical History:  Procedure Laterality Date   CYST REMOVAL HAND     wrist   EXCISION MASS NECK N/A 12/01/2017   Procedure: EXCISION MASS POSTERIOR NECK;  Surgeon: Stefanie Polio, MD;  Location: [place];  Service: Plastics;  Laterality: N/A;   FOOT SURGERY     LIPOSUCTION N/A 12/01/2017   Procedure: LIPOSUCTION;  Surgeon: Stefanie Polio, MD;  Location: [place];  Service: Plastics;  Laterality: N/A;   OVARIAN CYST REMOVAL     REDUCTION MAMMAPLASTY Bilateral    ROBOTIC ASSISTED LAPAROSCOPIC HYSTERECTOMY AND SALPINGECTOMY Bilateral 10/06/2018   Procedure: XI ROBOTIC ASSISTED TOTAL LAPAROSCOPIC HYSTERECTOMY AND SALPINGECTOMY;  Surgeon: Stefanie Bruins, MD;  Location: [facility];  Service: Gynecology;  Laterality: Bilateral;   ROBOTIC ASSISTED LAPAROSCOPIC LYSIS OF ADHESION  10/06/2018   Procedure: XI ROBOTIC ASSISTED LAPAROSCOPIC LYSIS OF ADHESION;  Surgeon: Stefanie Bruins, MD;  Location: [facility];  Service: Gynecology;;   Patient Active Problem List   Diagnosis Date Noted   Back pain  12/03/2022   Neck pain 12/03/2022   Symptomatic mammary hypertrophy 12/03/2022   Acute pansinusitis 09/07/2020   Candidal vulvovaginitis 09/07/2020   Dysuria 09/07/2020   Urine finding 09/07/2020   Chronic migraine with aura 01/18/2020   Post-operative state 10/06/2018   Eschar of toe 06/14/2015   Metatarsal deformity 06/14/2015   Discoloration of skin of foot 06/07/2014    REFERRING DIAG: Pain in thoracic spine   Cervicalgia   Muscle weakness  THERAPY DIAG:  Pain in thoracic spine  Cervicalgia  Muscle weakness  Rationale for Evaluation and Treatment Rehabilitation  PERTINENT HISTORY: migraine   PRECAUTIONS: None   SUBJECTIVE:  SUBJECTIVE STATEMENT:    Pt reports that she was sore after her first treatment.   PAIN:  Are you having pain? Yes: NPRS scale: 7/10 Pain location: neck and upper back Pain description: ache Aggravating factors: activity Relieving factors: rest   OBJECTIVE: (objective measures completed at initial evaluation unless otherwise dated)     DIAGNOSTIC FINDINGS:  NA   GENERAL OBSERVATION:          Mammary hypertrophy, forward head, rounded shoulders                               SENSATION:          Light touch: Appears intact           PALPATION: Bil UT, cervical paraspinals, sub occipitals   Cervical ROM   ROM ROM  12/19/2022  Flexion 40  Extension 50  Right lateral flexion 25*  Left lateral flexion 25*  Right rotation 45*  Left rotation 45*  Flexion rotation (normal is 30 degrees)    Flexion rotation (normal is 30 degrees)      (Blank rows = not tested, N = WNL, * = concordant pain)     Throacic AROM   AROM AROM  12/19/2022  Flexion limited by 50%, w/ concordant pain  Extension limited by 75%, w/ concordant pain  Right lateral flexion     Left lateral flexion    Right rotation limited by 75%, w/ concordant pain  Left rotation limited by >75%, w/ concordant pain    (Blank rows = not tested)       UPPER EXTREMITY MMT:   MMT Right 12/19/2022 Left 12/19/2022  Shoulder flexion      Shoulder abduction (C5)      Shoulder ER      Shoulder IR      Middle trapezius 3+ 3+  Lower trapezius Unable to get into position Unable to get into position  Shoulder extension      Grip strength      Cervical flexion (C1,C2)      Cervical S/B (C3)      Shoulder shrug (C4)      Elbow flexion (C6)      Elbow ext (C7)      Thumb ext (C8)      Finger abd (T1)      Grossly        (Blank rows = not tested, score listed is out of 5 possible points.  N = WNL, D = diminished, C = clear for gross weakness with myotome testing, * = concordant pain with testing)     TODAY'S TREATMENT:   [facility] Adult PT Treatment: Therapeutic Exercise: UBE - L2 - 5 min Seated shoulder rolls - 20x ea Supine YTB diagonals - 20x ea Supine hor abd YTB 3x10 Open book 10/10 Chin tucks 10x2 Supine cervical rotation 10x Low row - 2x10 - 10# High row - 2x10 - 10# Lat pull down - 2x10 - 10#    HOME EXERCISE PROGRAM:   Access Code: [email or web address] URL: [email or web address] Date: 12/19/2022 Prepared by: Stefanie Baldwin   Exercises - Seated Assisted Cervical Rotation with Towel  - 3 x daily - 7 x weekly - 2 sets - 15 reps - Sidelying Open Book  - 1 x daily - 7 x weekly - 3 sets - 10 reps - Seated Shoulder Horizontal Abduction with Resistance - Palms Down  - 1 x daily - 7 x  weekly - 3 sets - 10 reps     ASSESSMENT:   CLINICAL IMPRESSION:  Stefanie Baldwin tolerated session well with no adverse reaction.  Concentrated on cervical and thoracic mobility combined with periscapular and DNF stretching.  Cued for form and pacing throughout.  Pt with fatigue but no increase in pain.    OBJECTIVE IMPAIRMENTS: Pain, cervical ROM, thoracic ROM, periscapular  strength   ACTIVITY LIMITATIONS: bending, standing, walking, working   PERSONAL FACTORS: See medical history and pertinent history     REHAB POTENTIAL: Fair chronic and severe   CLINICAL DECISION MAKING: Stable/uncomplicated   EVALUATION COMPLEXITY: Low     GOALS:   SHORT TERM GOALS: Stefanie Baldwin will be >75% HEP compliant throughout therapy to improve carryover between sessions and facilitate independent management of condition.   LONG TERM GOALS:    Stefanie Baldwin will self report >/= 50% decrease in pain from evaluation    Evaluation/Baseline (12/19/2022): 8/10 max pain Target date: 02/13/2023 Goal status: INITIAL    2.  Jilliane will improve the following MMTs to >/= 4/5 to show improvement in strength:  mid traps    Evaluation/Baseline (12/19/2022): see chart in note Target date: 02/13/2023 Goal status: INITIAL   3.  Kirbi will report confidence in self management of condition at time of discharge with advanced HEP   Evaluation/Baseline (12/19/2022): unable to self manage Target date: 02/13/2023 Goal status: INITIAL       PLAN: PT FREQUENCY: 1-2x/week   PT DURATION: 8 weeks (Ending 02/13/2023)   PLANNED INTERVENTIONS: Therapeutic exercises, Aquatic therapy, Therapeutic activity, Neuro Muscular re-education, Gait training, Patient/Family education, Joint mobilization, Dry Needling, Electrical stimulation, Spinal mobilization and/or manipulation, Moist heat, Taping, Vasopneumatic device, Ionotophoresis '4mg'$ /ml Dexamethasone, and Manual therapy   PLAN FOR NEXT SESSION: Progressive throacic mobility along with postural muscle strengthening   Mathis Dad, PT 01/03/2023, 7:45 AM

## 2023-01-09 ENCOUNTER — Encounter: Payer: 59 | Admitting: Physical Therapy

## 2023-01-10 ENCOUNTER — Encounter: Payer: Self-pay | Admitting: Physical Therapy

## 2023-01-10 ENCOUNTER — Ambulatory Visit: Payer: 59 | Admitting: Physical Therapy

## 2023-01-10 DIAGNOSIS — M546 Pain in thoracic spine: Secondary | ICD-10-CM | POA: Diagnosis not present

## 2023-01-10 DIAGNOSIS — M6281 Muscle weakness (generalized): Secondary | ICD-10-CM

## 2023-01-10 DIAGNOSIS — M542 Cervicalgia: Secondary | ICD-10-CM

## 2023-01-10 NOTE — Therapy (Signed)
OUTPATIENT PHYSICAL THERAPY TREATMENT NOTE   Patient Name: Stefanie Baldwin MRN: [id number] DOB:12/25/1970, 52 y.o., female Today's Date: 01/10/2023  PCP: Everardo Beals, NP  REFERRING PROVIDER: Wallace Going, DO   END OF SESSION:   PT End of Session - 01/10/23 0705     Visit Number 4    Number of Visits --   1-2x/week   Date for PT Re-Evaluation 02/13/23    Authorization Type Aetna    PT Start Time 0704    PT Stop Time 0743    PT Time Calculation (min) 39 min             Past Medical History:  Diagnosis Date   Complication of anesthesia    Ectopic pregnancy    Fibroids    uterine    GERD (gastroesophageal reflux disease)    uses OTC meds prn   Headache    Mass    on neck and chest   Obesity    Pneumonia 2 years ago    PONV (postoperative nausea and vomiting)    "severe" PONV ; does well with "patch and IV antinausea"    Past Surgical History:  Procedure Laterality Date   CYST REMOVAL HAND     wrist   EXCISION MASS NECK N/A 12/01/2017   Procedure: EXCISION MASS POSTERIOR NECK;  Surgeon: Cristine Polio, MD;  Location: [place];  Service: Plastics;  Laterality: N/A;   FOOT SURGERY     LIPOSUCTION N/A 12/01/2017   Procedure: LIPOSUCTION;  Surgeon: Cristine Polio, MD;  Location: [place];  Service: Plastics;  Laterality: N/A;   OVARIAN CYST REMOVAL     REDUCTION MAMMAPLASTY Bilateral    ROBOTIC ASSISTED LAPAROSCOPIC HYSTERECTOMY AND SALPINGECTOMY Bilateral 10/06/2018   Procedure: XI ROBOTIC ASSISTED TOTAL LAPAROSCOPIC HYSTERECTOMY AND SALPINGECTOMY;  Surgeon: [name], MD;  Location: [facility];  Service: Gynecology;  Laterality: Bilateral;   ROBOTIC ASSISTED LAPAROSCOPIC LYSIS OF ADHESION  10/06/2018   Procedure: XI ROBOTIC ASSISTED LAPAROSCOPIC LYSIS OF ADHESION;  Surgeon: [name], MD;  Location: [facility];  Service: Gynecology;;   Patient Active Problem List   Diagnosis Date Noted   Back pain  12/03/2022   Neck pain 12/03/2022   Symptomatic mammary hypertrophy 12/03/2022   Acute pansinusitis 09/07/2020   Candidal vulvovaginitis 09/07/2020   Dysuria 09/07/2020   Urine finding 09/07/2020   Chronic migraine with aura 01/18/2020   Post-operative state 10/06/2018   Eschar of toe 06/14/2015   Metatarsal deformity 06/14/2015   Discoloration of skin of foot 06/07/2014    REFERRING DIAG: Pain in thoracic spine   Cervicalgia   Muscle weakness  THERAPY DIAG:  Pain in thoracic spine  Cervicalgia  Muscle weakness  Rationale for Evaluation and Treatment Rehabilitation  PERTINENT HISTORY: migraine   PRECAUTIONS: None   SUBJECTIVE:  SUBJECTIVE STATEMENT:    Pt reports some soreness after last session.   PAIN:  Are you having pain? Yes: NPRS scale: 6/10 Pain location: neck and upper back Pain description: ache Aggravating factors: activity Relieving factors: rest   OBJECTIVE: (objective measures completed at initial evaluation unless otherwise dated)     DIAGNOSTIC FINDINGS:  NA   GENERAL OBSERVATION:          Mammary hypertrophy, forward head, rounded shoulders                               SENSATION:          Light touch: Appears intact           PALPATION: Bil UT, cervical paraspinals, sub occipitals   Cervical ROM   ROM ROM  12/19/2022  Flexion 40  Extension 50  Right lateral flexion 25*  Left lateral flexion 25*  Right rotation 45*  Left rotation 45*  Flexion rotation (normal is 30 degrees)    Flexion rotation (normal is 30 degrees)      (Blank rows = not tested, N = WNL, * = concordant pain)     Throacic AROM   AROM AROM  12/19/2022  Flexion limited by 50%, w/ concordant pain  Extension limited by 75%, w/ concordant pain  Right lateral flexion    Left lateral  flexion    Right rotation limited by 75%, w/ concordant pain  Left rotation limited by >75%, w/ concordant pain    (Blank rows = not tested)       UPPER EXTREMITY MMT:   MMT Right 12/19/2022 Left 12/19/2022  Shoulder flexion      Shoulder abduction (C5)      Shoulder ER      Shoulder IR      Middle trapezius 3+ 3+  Lower trapezius Unable to get into position Unable to get into position  Shoulder extension      Grip strength      Cervical flexion (C1,C2)      Cervical S/B (C3)      Shoulder shrug (C4)      Elbow flexion (C6)      Elbow ext (C7)      Thumb ext (C8)      Finger abd (T1)      Grossly        (Blank rows = not tested, score listed is out of 5 possible points.  N = WNL, D = diminished, C = clear for gross weakness with myotome testing, * = concordant pain with testing)     TODAY'S TREATMENT:   [facility] Adult PT Treatment: Therapeutic Exercise: UBE - L2 - 5 min Supine RTB diagonals - 20x ea Supine hor abd RTB 3x10 Open book 10/10 Chin tucks 10x2 Supine cervical rotation 10x Low row - 2x15 - 15# High row - 2x15 - 15# Lat pull down - 2x15 - 15#    HOME EXERCISE PROGRAM:   Access Code: [email or web address] URL: [email or web address] Date: 12/19/2022 Prepared by: Shearon Balo   Exercises - Seated Assisted Cervical Rotation with Towel  - 3 x daily - 7 x weekly - 2 sets - 15 reps - Sidelying Open Book  - 1 x daily - 7 x weekly - 3 sets - 10 reps - Seated Shoulder Horizontal Abduction with Resistance - Palms Down  - 1 x daily - 7 x weekly - 3 sets - 10 reps  ASSESSMENT:   CLINICAL IMPRESSION:  Stefanie Baldwin tolerated session well with no adverse reaction.  No increase in pain today.  Increased load with with all strength exercises.  Will continue to progress as able.    OBJECTIVE IMPAIRMENTS: Pain, cervical ROM, thoracic ROM, periscapular strength   ACTIVITY LIMITATIONS: bending, standing, walking, working   PERSONAL FACTORS: See medical history and  pertinent history     REHAB POTENTIAL: Fair chronic and severe   CLINICAL DECISION MAKING: Stable/uncomplicated   EVALUATION COMPLEXITY: Low     GOALS:   SHORT TERM GOALS: Stefanie Baldwin will be >75% HEP compliant throughout therapy to improve carryover between sessions and facilitate independent management of condition.   LONG TERM GOALS:    Stefanie Baldwin will self report >/= 50% decrease in pain from evaluation    Evaluation/Baseline (12/19/2022): 8/10 max pain Target date: 02/13/2023 Goal status: INITIAL    2.  Stefanie Baldwin will improve the following MMTs to >/= 4/5 to show improvement in strength:  mid traps    Evaluation/Baseline (12/19/2022): see chart in note Target date: 02/13/2023 Goal status: INITIAL   3.  Stefanie Baldwin will report confidence in self management of condition at time of discharge with advanced HEP   Evaluation/Baseline (12/19/2022): unable to self manage Target date: 02/13/2023 Goal status: INITIAL       PLAN: PT FREQUENCY: 1-2x/week   PT DURATION: 8 weeks (Ending 02/13/2023)   PLANNED INTERVENTIONS: Therapeutic exercises, Aquatic therapy, Therapeutic activity, Neuro Muscular re-education, Gait training, Patient/Family education, Joint mobilization, Dry Needling, Electrical stimulation, Spinal mobilization and/or manipulation, Moist heat, Taping, Vasopneumatic device, Ionotophoresis '4mg'$ /ml Dexamethasone, and Manual therapy   PLAN FOR NEXT SESSION: Progressive throacic mobility along with postural muscle strengthening   Mathis Dad, PT 01/10/2023, 7:44 AM

## 2023-01-16 ENCOUNTER — Encounter: Payer: 59 | Admitting: Physical Therapy

## 2023-01-17 ENCOUNTER — Ambulatory Visit: Payer: 59 | Attending: Plastic Surgery | Admitting: Physical Therapy

## 2023-01-17 ENCOUNTER — Encounter: Payer: Self-pay | Admitting: Physical Therapy

## 2023-01-17 DIAGNOSIS — M546 Pain in thoracic spine: Secondary | ICD-10-CM | POA: Diagnosis present

## 2023-01-17 DIAGNOSIS — M542 Cervicalgia: Secondary | ICD-10-CM | POA: Insufficient documentation

## 2023-01-17 DIAGNOSIS — M6281 Muscle weakness (generalized): Secondary | ICD-10-CM

## 2023-01-17 NOTE — Therapy (Signed)
OUTPATIENT PHYSICAL THERAPY TREATMENT NOTE   Patient Name: Stefanie Baldwin MRN: [id number] DOB:November 19, 1971, 52 y.o., female Today's Date: 01/17/2023  PCP: Everardo Beals, NP  REFERRING PROVIDER: Wallace Going, DO   END OF SESSION:   PT End of Session - 01/17/23 0707     Visit Number 5    Number of Visits --   1-2x/week   Date for PT Re-Evaluation 02/13/23    Authorization Type Aetna    PT Start Time 0705    PT Stop Time 0745    PT Time Calculation (min) 40 min             Past Medical History:  Diagnosis Date   Complication of anesthesia    Ectopic pregnancy    Fibroids    uterine    GERD (gastroesophageal reflux disease)    uses OTC meds prn   Headache    Mass    on neck and chest   Obesity    Pneumonia 2 years ago    PONV (postoperative nausea and vomiting)    "severe" PONV ; does well with "patch and IV antinausea"    Past Surgical History:  Procedure Laterality Date   CYST REMOVAL HAND     wrist   EXCISION MASS NECK N/A 12/01/2017   Procedure: EXCISION MASS POSTERIOR NECK;  Surgeon: Cristine Polio, MD;  Location: [place];  Service: Plastics;  Laterality: N/A;   FOOT SURGERY     LIPOSUCTION N/A 12/01/2017   Procedure: LIPOSUCTION;  Surgeon: Cristine Polio, MD;  Location: [place];  Service: Plastics;  Laterality: N/A;   OVARIAN CYST REMOVAL     REDUCTION MAMMAPLASTY Bilateral    ROBOTIC ASSISTED LAPAROSCOPIC HYSTERECTOMY AND SALPINGECTOMY Bilateral 10/06/2018   Procedure: XI ROBOTIC ASSISTED TOTAL LAPAROSCOPIC HYSTERECTOMY AND SALPINGECTOMY;  Surgeon: [name], MD;  Location: [facility];  Service: Gynecology;  Laterality: Bilateral;   ROBOTIC ASSISTED LAPAROSCOPIC LYSIS OF ADHESION  10/06/2018   Procedure: XI ROBOTIC ASSISTED LAPAROSCOPIC LYSIS OF ADHESION;  Surgeon: [name], MD;  Location: [facility];  Service: Gynecology;;   Patient Active Problem List   Diagnosis Date Noted   Back pain  12/03/2022   Neck pain 12/03/2022   Symptomatic mammary hypertrophy 12/03/2022   Acute pansinusitis 09/07/2020   Candidal vulvovaginitis 09/07/2020   Dysuria 09/07/2020   Urine finding 09/07/2020   Chronic migraine with aura 01/18/2020   Post-operative state 10/06/2018   Eschar of toe 06/14/2015   Metatarsal deformity 06/14/2015   Discoloration of skin of foot 06/07/2014    REFERRING DIAG: Pain in thoracic spine   Cervicalgia   Muscle weakness  THERAPY DIAG:  Pain in thoracic spine  Cervicalgia  Muscle weakness  Rationale for Evaluation and Treatment Rehabilitation  PERTINENT HISTORY: migraine   PRECAUTIONS: None   SUBJECTIVE:  SUBJECTIVE STATEMENT:    Pt reports continued pain.  She as stopped taking her NSAID.   PAIN:  Are you having pain? Yes: NPRS scale: 6/10 Pain location: neck and upper back Pain description: ache Aggravating factors: activity Relieving factors: rest   OBJECTIVE: (objective measures completed at initial evaluation unless otherwise dated)     DIAGNOSTIC FINDINGS:  NA   GENERAL OBSERVATION:          Mammary hypertrophy, forward head, rounded shoulders                               SENSATION:          Light touch: Appears intact           PALPATION: Bil UT, cervical paraspinals, sub occipitals   Cervical ROM   ROM ROM  12/19/2022  Flexion 40  Extension 50  Right lateral flexion 25*  Left lateral flexion 25*  Right rotation 45*  Left rotation 45*  Flexion rotation (normal is 30 degrees)    Flexion rotation (normal is 30 degrees)      (Blank rows = not tested, N = WNL, * = concordant pain)     Throacic AROM   AROM AROM  12/19/2022  Flexion limited by 50%, w/ concordant pain  Extension limited by 75%, w/ concordant pain  Right lateral flexion     Left lateral flexion    Right rotation limited by 75%, w/ concordant pain  Left rotation limited by >75%, w/ concordant pain    (Blank rows = not tested)       UPPER EXTREMITY MMT:   MMT Right 12/19/2022 Left 12/19/2022  Shoulder flexion      Shoulder abduction (C5)      Shoulder ER      Shoulder IR      Middle trapezius 3+ 3+  Lower trapezius Unable to get into position Unable to get into position  Shoulder extension      Grip strength      Cervical flexion (C1,C2)      Cervical S/B (C3)      Shoulder shrug (C4)      Elbow flexion (C6)      Elbow ext (C7)      Thumb ext (C8)      Finger abd (T1)      Grossly        (Blank rows = not tested, score listed is out of 5 possible points.  N = WNL, D = diminished, C = clear for gross weakness with myotome testing, * = concordant pain with testing)     TODAY'S TREATMENT:   [facility] Adult PT Treatment: Therapeutic Exercise: UBE - L2 - 5 min Supine RTB diagonals - 20x ea Supine hor abd GTB 3x10 Open book 15/15 Chin tucks 10x2 Supine cervical rotation 10x Low row - 2x15 - 20# High row - 2x15 - 20# Lat pull down - 2x15 - 20# Shoulder rolls - 20x ea Farmers carry - 7# - 180' ea    HOME EXERCISE PROGRAM:   Access Code: [email or web address] URL: [email or web address] Date: 12/19/2022 Prepared by: Shearon Balo   Exercises - Seated Assisted Cervical Rotation with Towel  - 3 x daily - 7 x weekly - 2 sets - 15 reps - Sidelying Open Book  - 1 x daily - 7 x weekly - 3 sets - 10 reps - Seated Shoulder Horizontal Abduction with Resistance - Palms Down  -  1 x daily - 7 x weekly - 3 sets - 10 reps     ASSESSMENT:   CLINICAL IMPRESSION:  Jaela tolerated session well with no adverse reaction.  Increased load with horizontal abd and rows today.  This is challenging but tolerable for Barbaraann.  Tolerated farmer's carry well.  Continue per POC.    OBJECTIVE IMPAIRMENTS: Pain, cervical ROM, thoracic ROM, periscapular strength    ACTIVITY LIMITATIONS: bending, standing, walking, working   PERSONAL FACTORS: See medical history and pertinent history     REHAB POTENTIAL: Fair chronic and severe   CLINICAL DECISION MAKING: Stable/uncomplicated   EVALUATION COMPLEXITY: Low     GOALS:   SHORT TERM GOALS: Anjela will be >75% HEP compliant throughout therapy to improve carryover between sessions and facilitate independent management of condition.   LONG TERM GOALS:    Marleah will self report >/= 50% decrease in pain from evaluation    Evaluation/Baseline (12/19/2022): 8/10 max pain Target date: 02/13/2023 Goal status: INITIAL    2.  Alazne will improve the following MMTs to >/= 4/5 to show improvement in strength:  mid traps    Evaluation/Baseline (12/19/2022): see chart in note Target date: 02/13/2023 Goal status: INITIAL   3.  Otila will report confidence in self management of condition at time of discharge with advanced HEP   Evaluation/Baseline (12/19/2022): unable to self manage Target date: 02/13/2023 Goal status: INITIAL       PLAN: PT FREQUENCY: 1-2x/week   PT DURATION: 8 weeks (Ending 02/13/2023)   PLANNED INTERVENTIONS: Therapeutic exercises, Aquatic therapy, Therapeutic activity, Neuro Muscular re-education, Gait training, Patient/Family education, Joint mobilization, Dry Needling, Electrical stimulation, Spinal mobilization and/or manipulation, Moist heat, Taping, Vasopneumatic device, Ionotophoresis '4mg'$ /ml Dexamethasone, and Manual therapy   PLAN FOR NEXT SESSION: Progressive throacic mobility along with postural muscle strengthening   Mathis Dad, PT 01/17/2023, 7:48 AM

## 2023-01-22 ENCOUNTER — Encounter: Payer: Self-pay | Admitting: Physical Therapy

## 2023-01-22 ENCOUNTER — Ambulatory Visit: Payer: 59 | Admitting: Physical Therapy

## 2023-01-22 ENCOUNTER — Ambulatory Visit: Payer: 59

## 2023-01-22 DIAGNOSIS — M546 Pain in thoracic spine: Secondary | ICD-10-CM | POA: Diagnosis not present

## 2023-01-22 DIAGNOSIS — M542 Cervicalgia: Secondary | ICD-10-CM

## 2023-01-22 DIAGNOSIS — M6281 Muscle weakness (generalized): Secondary | ICD-10-CM

## 2023-01-22 NOTE — Therapy (Signed)
OUTPATIENT PHYSICAL THERAPY TREATMENT NOTE   Patient Name: Stefanie Baldwin MRN: [id number] DOB:[birth year], 52 y.o., female Today's Date: 01/22/2023  PCP: Everardo Beals, NP  REFERRING PROVIDER: Wallace Going, DO   END OF SESSION:   PT End of Session - 01/22/23 1535     Visit Number 6    Number of Visits --   1-2x/week   Date for PT Re-Evaluation 02/13/23    Authorization Type [employer]    PT Start Time 8675    PT Stop Time 1613    PT Time Calculation (min) 38 min             Past Medical History:  Diagnosis Date   Complication of anesthesia    Ectopic pregnancy    Fibroids    uterine    GERD (gastroesophageal reflux disease)    uses OTC meds prn   Headache    Mass    on neck and chest   Obesity    Pneumonia 2 years ago    PONV (postoperative nausea and vomiting)    "severe" PONV ; does well with "patch and IV antinausea"    Past Surgical History:  Procedure Laterality Date   CYST REMOVAL HAND     wrist   EXCISION MASS NECK N/A 12/01/2017   Procedure: EXCISION MASS POSTERIOR NECK;  Surgeon: Cristine Polio, MD;  Location: [facility];  Service: Plastics;  Laterality: N/A;   FOOT SURGERY     LIPOSUCTION N/A 12/01/2017   Procedure: LIPOSUCTION;  Surgeon: Cristine Polio, MD;  Location: [facility];  Service: Plastics;  Laterality: N/A;   OVARIAN CYST REMOVAL     REDUCTION MAMMAPLASTY Bilateral    ROBOTIC ASSISTED LAPAROSCOPIC HYSTERECTOMY AND SALPINGECTOMY Bilateral 10/06/2018   Procedure: XI ROBOTIC ASSISTED TOTAL LAPAROSCOPIC HYSTERECTOMY AND SALPINGECTOMY;  Surgeon: [name], MD;  Location: [facility];  Service: Gynecology;  Laterality: Bilateral;   ROBOTIC ASSISTED LAPAROSCOPIC LYSIS OF ADHESION  10/06/2018   Procedure: XI ROBOTIC ASSISTED LAPAROSCOPIC LYSIS OF ADHESION;  Surgeon: [name], MD;  Location: [facility];  Service: Gynecology;;   Patient Active Problem List   Diagnosis Date Noted   Back pain  12/03/2022   Neck pain 12/03/2022   Symptomatic mammary hypertrophy 12/03/2022   Acute pansinusitis 09/07/2020   Candidal vulvovaginitis 09/07/2020   Dysuria 09/07/2020   Urine finding 09/07/2020   Chronic migraine with aura 01/18/2020   Post-operative state 10/06/2018   Eschar of toe 06/14/2015   Metatarsal deformity 06/14/2015   Discoloration of skin of foot 06/07/2014    REFERRING DIAG: Pain in thoracic spine   Cervicalgia   Muscle weakness  THERAPY DIAG:  Pain in thoracic spine  Cervicalgia  Muscle weakness  Rationale for Evaluation and Treatment Rehabilitation  PERTINENT HISTORY: migraine   PRECAUTIONS: None   SUBJECTIVE:  SUBJECTIVE STATEMENT:    Pt states that she is doing ok.  She continues to endorse pain in her mid back.   PAIN:  Are you having pain? Yes: NPRS scale: 6/10 Pain location: neck and upper back Pain description: ache Aggravating factors: activity Relieving factors: rest   OBJECTIVE: (objective measures completed at initial evaluation unless otherwise dated)     DIAGNOSTIC FINDINGS:  NA   GENERAL OBSERVATION:          Mammary hypertrophy, forward head, rounded shoulders                               SENSATION:          Light touch: Appears intact           PALPATION: Bil UT, cervical paraspinals, sub occipitals   Cervical ROM   ROM ROM  12/19/2022  Flexion 40  Extension 50  Right lateral flexion 25*  Left lateral flexion 25*  Right rotation 45*  Left rotation 45*  Flexion rotation (normal is 30 degrees)    Flexion rotation (normal is 30 degrees)      (Blank rows = not tested, N = WNL, * = concordant pain)     Throacic AROM   AROM AROM  12/19/2022  Flexion limited by 50%, w/ concordant pain  Extension limited by 75%, w/ concordant pain   Right lateral flexion    Left lateral flexion    Right rotation limited by 75%, w/ concordant pain  Left rotation limited by >75%, w/ concordant pain    (Blank rows = not tested)       UPPER EXTREMITY MMT:   MMT Right 12/19/2022 Left 12/19/2022  Shoulder flexion      Shoulder abduction (C5)      Shoulder ER      Shoulder IR      Middle trapezius 3+ 3+  Lower trapezius Unable to get into position Unable to get into position  Shoulder extension      Grip strength      Cervical flexion (C1,C2)      Cervical S/B (C3)      Shoulder shrug (C4)      Elbow flexion (C6)      Elbow ext (C7)      Thumb ext (C8)      Finger abd (T1)      Grossly        (Blank rows = not tested, score listed is out of 5 possible points.  N = WNL, D = diminished, C = clear for gross weakness with myotome testing, * = concordant pain with testing)     TODAY'S TREATMENT:   [facility] Adult PT Treatment: Therapeutic Exercise: UBE - L2.5 - 5 min Supine GTB diagonals - 20x ea Supine hor abd GTB at wall - 3x10 Open book 15/15 (not today) Chin tucks 10x2 Supine cervical rotation 10x Low row - 2x15 - 20# High row - 2x15 - 20# Lat pull down - 2x15 - 20# Chest press 10# - 3x10 Shoulder rolls - 20x ea Farmers carry - 10# - 180' ea    HOME EXERCISE PROGRAM:   Access Code: [email or web address] URL: [email or web address] Date: 12/19/2022 Prepared by: Shearon Balo   Exercises - Seated Assisted Cervical Rotation with Towel  - 3 x daily - 7 x weekly - 2 sets - 15 reps - Sidelying Open Book  - 1 x daily - 7 x weekly -  3 sets - 10 reps - Seated Shoulder Horizontal Abduction with Resistance - Palms Down  - 1 x daily - 7 x weekly - 3 sets - 10 reps     ASSESSMENT:   CLINICAL IMPRESSION:  Lachrista tolerated session well with no adverse reaction.  Continued to focus on periscapular strengthening.  No complaint of increased pain with therapy.  Gradually increasing load  Continue per POC.    OBJECTIVE  IMPAIRMENTS: Pain, cervical ROM, thoracic ROM, periscapular strength   ACTIVITY LIMITATIONS: bending, standing, walking, working   PERSONAL FACTORS: See medical history and pertinent history     REHAB POTENTIAL: Fair chronic and severe   CLINICAL DECISION MAKING: Stable/uncomplicated   EVALUATION COMPLEXITY: Low     GOALS:   SHORT TERM GOALS: Yoselin will be >75% HEP compliant throughout therapy to improve carryover between sessions and facilitate independent management of condition.   LONG TERM GOALS:    Adhira will self report >/= 50% decrease in pain from evaluation    Evaluation/Baseline (12/19/2022): 8/10 max pain Target date: 02/13/2023 Goal status: INITIAL    2.  Neesa will improve the following MMTs to >/= 4/5 to show improvement in strength:  mid traps    Evaluation/Baseline (12/19/2022): see chart in note Target date: 02/13/2023 Goal status: INITIAL   3.  Korie will report confidence in self management of condition at time of discharge with advanced HEP   Evaluation/Baseline (12/19/2022): unable to self manage Target date: 02/13/2023 Goal status: INITIAL       PLAN: PT FREQUENCY: 1-2x/week   PT DURATION: 8 weeks (Ending 02/13/2023)   PLANNED INTERVENTIONS: Therapeutic exercises, Aquatic therapy, Therapeutic activity, Neuro Muscular re-education, Gait training, Patient/Family education, Joint mobilization, Dry Needling, Electrical stimulation, Spinal mobilization and/or manipulation, Moist heat, Taping, Vasopneumatic device, Ionotophoresis '4mg'$ /ml Dexamethasone, and Manual therapy   PLAN FOR NEXT SESSION: Progressive throacic mobility along with postural muscle strengthening   Mathis Dad, PT 01/22/2023, 4:14 PM

## 2023-01-23 ENCOUNTER — Encounter: Payer: 59 | Admitting: Physical Therapy

## 2023-01-24 ENCOUNTER — Ambulatory Visit: Payer: 59 | Admitting: Physical Therapy

## 2023-01-30 ENCOUNTER — Encounter: Payer: 59 | Admitting: Physical Therapy

## 2023-01-31 ENCOUNTER — Encounter: Payer: Self-pay | Admitting: Physical Therapy

## 2023-01-31 ENCOUNTER — Ambulatory Visit: Payer: 59 | Admitting: Physical Therapy

## 2023-01-31 DIAGNOSIS — M546 Pain in thoracic spine: Secondary | ICD-10-CM

## 2023-01-31 DIAGNOSIS — M542 Cervicalgia: Secondary | ICD-10-CM

## 2023-01-31 DIAGNOSIS — M6281 Muscle weakness (generalized): Secondary | ICD-10-CM

## 2023-01-31 NOTE — Therapy (Signed)
PHYSICAL THERAPY DISCHARGE SUMMARY  Visits from Start of Care: 7  Current functional level related to goals / functional outcomes: See assessment/goals   Remaining deficits: See assessment/goals   Education / Equipment: HEP and D/C plans  Patient agrees to discharge. Patient goals were partially met. Patient is being discharged due to lack of progress.   Patient Name: Stefanie Baldwin MRN: [id number] DOB:August 31, 1971, 52 y.o., female Today's Date: 01/31/2023  PCP: Everardo Beals, NP  REFERRING PROVIDER: Wallace Going, DO   END OF SESSION:   PT End of Session - 01/31/23 0706     Visit Number 7    Number of Visits --   1-2x/week   Date for PT Re-Evaluation 02/13/23    Authorization Type [employer]et[employer]    PT Start Time 0705    PT Stop Time 0743    PT Time Calculation (min) 38 min             Past Medical History:  Diagnosis Date   Complication of anesthesia    Ectopic pregnancy    Fibroids    uterine    GERD (gastroesophageal reflux disease)    uses OTC meds prn   Headache    Mass    on neck and chest   Obesity    Pneumonia 2 years ago    PONV (postoperative nausea and vomiting)    "severe" PONV ; does well with "patch and IV antinausea"    Past Surgical History:  Procedure Laterality Date   CYST REMOVAL HAND     wrist   EXCISION MASS NECK N/A 12/01/2017   Procedure: EXCISION MASS POSTERIOR NECK;  Surgeon: Cristine Polio, MD;  Location: [facility];  Service: Plastics;  Laterality: N/A;   FOOT SURGERY     LIPOSUCTION N/A 12/01/2017   Procedure: LIPOSUCTION;  Surgeon: Cristine Polio, MD;  Location: [facility];  Service: Plastics;  Laterality: N/A;   OVARIAN CYST REMOVAL     REDUCTION MAMMAPLASTY Bilateral    ROBOTIC ASSISTED LAPAROSCOPIC HYSTERECTOMY AND SALPINGECTOMY Bilateral 10/06/2018   Procedure: XI ROBOTIC ASSISTED TOTAL LAPAROSCOPIC HYSTERECTOMY AND SALPINGECTOMY;  Surgeon: [name], MD;  Location: [facility];  Service: Gynecology;  Laterality: Bilateral;   ROBOTIC ASSISTED LAPAROSCOPIC LYSIS OF ADHESION  10/06/2018   Procedure: XI ROBOTIC ASSISTED LAPAROSCOPIC LYSIS OF ADHESION;  Surgeon: [name], MD;  Location: [facility];  Service: Gynecology;;   Patient Active Problem List   Diagnosis Date Noted   Back pain 12/03/2022   Neck pain 12/03/2022   Symptomatic mammary hypertrophy 12/03/2022   Acute pansinusitis 09/07/2020   Candidal vulvovaginitis 09/07/2020   Dysuria 09/07/2020   Urine finding 09/07/2020   Chronic migraine with aura 01/18/2020   Post-operative state 10/06/2018   Eschar of toe 06/14/2015   Metatarsal deformity 06/14/2015   Discoloration of skin of foot 06/07/2014    REFERRING DIAG: Pain in thoracic spine   Cervicalgia   Muscle weakness  THERAPY DIAG:  Pain in thoracic spine  Cervicalgia  Muscle weakness  Rationale for Evaluation and Treatment Rehabilitation  PERTINENT HISTORY: migraine   PRECAUTIONS: None   SUBJECTIVE:  SUBJECTIVE STATEMENT:    Pt states that that she feel a bit looser from the exercises, but her pain remains high.   PAIN:  Are you having pain? Yes: NPRS scale: 6/10 Pain location: neck and upper back Pain description: ache Aggravating factors: activity Relieving factors: rest   OBJECTIVE: (objective measures completed at initial evaluation unless otherwise dated)     DIAGNOSTIC FINDINGS:  NA   GENERAL OBSERVATION:          Mammary hypertrophy, forward head, rounded shoulders                               SENSATION:          Light touch: Appears intact           PALPATION: Bil UT, cervical paraspinals, sub occipitals   Cervical ROM   ROM ROM  12/19/2022  Flexion 40  Extension 50  Right lateral flexion 25*  Left lateral flexion 25*   Right rotation 45*  Left rotation 45*  Flexion rotation (normal is 30 degrees)    Flexion rotation (normal is 30 degrees)      (Blank rows = not tested, N = WNL, * = concordant pain)     Throacic AROM   AROM AROM  12/19/2022  Flexion limited by 50%, w/ concordant pain  Extension limited by 75%, w/ concordant pain  Right lateral flexion    Left lateral flexion    Right rotation limited by 75%, w/ concordant pain  Left rotation limited by >75%, w/ concordant pain    (Blank rows = not tested)       UPPER EXTREMITY MMT:   MMT Right 12/19/2022 Left 12/19/2022 R/L 2/16   Shoulder flexion        Shoulder abduction (C5)        Shoulder ER        Shoulder IR        Middle trapezius 3+ 3+ 4/4   Lower trapezius Unable to get into position Unable to get into position 3+/3+   Shoulder extension        Grip strength        Cervical flexion (C1,C2)        Cervical S/B (C3)        Shoulder shrug (C4)        Elbow flexion (C6)        Elbow ext (C7)        Thumb ext (C8)        Finger abd (T1)        Grossly          (Blank rows = not tested, score listed is out of 5 possible points.  N = WNL, D = diminished, C = clear for gross weakness with myotome testing, * = concordant pain with testing)     TODAY'S TREATMENT:   [facility] Adult PT Treatment: Therapeutic Exercise: UBE - L2.5 - 5 min Supine GTB diagonals - 20x ea Supine hor abd GTB at wall - 3x10 Open book 15/15 Chin tucks 10x2 Supine cervical rotation 10x Low row - 2x15 - 25# High row - 2x15 - 25# Lat pull down - 2x15 - 25# Chest press 10# - 3x10 Shoulder rolls - 20x ea Farmers carry - 10# - 180' ea    HOME EXERCISE PROGRAM:   Access Code: [email or web address] URL: [email or web address] Date: 12/19/2022 Prepared by: Shearon Balo   Exercises - Seated Assisted  Cervical Rotation with Towel  - 3 x daily - 7 x weekly - 2 sets - 15 reps - Sidelying Open Book  - 1 x daily - 7 x weekly - 3 sets - 10 reps - Seated  Shoulder Horizontal Abduction with Resistance - Palms Down  - 1 x daily - 7 x weekly - 3 sets - 10 reps     ASSESSMENT:   CLINICAL IMPRESSION:  Stefanie Baldwin has progressed fair with therapy.  Improved impairments include: periscapular strength and thoracic mobility.  Functional improvements include: minimal functional improvements.  Progressions needed include: continued work at home with HEP.  Barriers to progress include: mammary hypertrophy.  Stefanie Baldwin has made some gains in strength and mobility but continues to have high levels of pain.  Please see GOALS section for progress on short term and long term goals established at evaluation.  I recommend D/C home with HEP; pt agrees with plan.    OBJECTIVE IMPAIRMENTS: Pain, cervical ROM, thoracic ROM, periscapular strength   ACTIVITY LIMITATIONS: bending, standing, walking, working   PERSONAL FACTORS: See medical history and pertinent history     REHAB POTENTIAL: Fair chronic and severe   CLINICAL DECISION MAKING: Stable/uncomplicated   EVALUATION COMPLEXITY: Low     GOALS:   SHORT TERM GOALS: Jewels will be >75% HEP compliant throughout therapy to improve carryover between sessions and facilitate independent management of condition.   LONG TERM GOALS:    Robyn will self report >/= 50% decrease in pain from evaluation    Evaluation/Baseline (12/19/2022): 8/10 max pain Target date: 02/13/2023 01/31/2023: 6-7/10 Goal status: NOT MET    2.  Dyna will improve the following MMTs to >/= 4/5 to show improvement in strength:  mid traps    Evaluation/Baseline (12/19/2022): see chart in note Target date: 02/13/2023 2/16: partially met Goal status: partially met   3.  Tyina will report confidence in self management of condition at time of discharge with advanced HEP   Evaluation/Baseline (12/19/2022): unable to self manage Target date: 02/13/2023 2/16: feels confident with HEP Goal status: MET       PLAN: PT FREQUENCY: 1-2x/week    PT DURATION: 8 weeks (Ending 02/13/2023)   PLANNED INTERVENTIONS: Therapeutic exercises, Aquatic therapy, Therapeutic activity, Neuro Muscular re-education, Gait training, Patient/Family education, Joint mobilization, Dry Needling, Electrical stimulation, Spinal mobilization and/or manipulation, Moist heat, Taping, Vasopneumatic device, Ionotophoresis 15m/ml Dexamethasone, and Manual therapy   PLAN FOR NEXT SESSION: Progressive throacic mobility along with postural muscle strengthening   Stefanie Baldwin PT 01/31/2023, 7:43 AM

## 2023-02-04 ENCOUNTER — Ambulatory Visit: Payer: 59 | Admitting: Student

## 2023-02-04 ENCOUNTER — Ambulatory Visit: Payer: 59 | Admitting: Surgical

## 2023-02-04 VITALS — Wt 252.2 lb

## 2023-02-04 DIAGNOSIS — Z6841 Body Mass Index (BMI) 40.0 and over, adult: Secondary | ICD-10-CM

## 2023-02-04 DIAGNOSIS — M549 Dorsalgia, unspecified: Secondary | ICD-10-CM

## 2023-02-04 DIAGNOSIS — M542 Cervicalgia: Secondary | ICD-10-CM | POA: Diagnosis not present

## 2023-02-04 DIAGNOSIS — R21 Rash and other nonspecific skin eruption: Secondary | ICD-10-CM

## 2023-02-04 DIAGNOSIS — N62 Hypertrophy of breast: Secondary | ICD-10-CM | POA: Diagnosis not present

## 2023-02-04 NOTE — Progress Notes (Addendum)
Referring Provider Everardo Beals, NP [address],  [place] [id number]   CC:  Chief Complaint  Patient presents with   Follow-up      Stefanie Baldwin is an 52 y.o. female.  HPI: Patient is a 52 y.o. 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   She was seen for initial consult by Dr. Morton Amy on 12/03/22.  At that time, she complained of symptoms including back pain and neck pain.  She also reported that her bra straps were causing grooving to her shoulders.  At this visit, her STN on the right was found to be 22 cm and her STN on the left was found to be 22 cm.  Her BMI was 39.9 kg/m.  Her preoperative bra size was a DDD cup.  The patient expressed the desire to pursue surgical intervention.  Patient also reported that she had underwent a bilateral breast reduction in 2001 by Dr. Towanda Malkin.  She is not sure how much she had removed at the time. Dr. Neita Goodnight operative note was obtained and it was found that an inferior pedicle technique was used at that time.  The amount of tissue that was removed was not noted in the op note.  Physical therapy and a mammogram was ordered for the patient.  Patient was found to be a good candidate for bilateral breast reduction.    Per chart review, patient underwent mammogram on 12/06/2022.  There is no mammographic evidence of malignancy.  BI-RADS Category 1: Negative.  Today, patient reports she is doing well.  She states that she completed 6 weeks of physical therapy.  She states that although this helped a little bit, she is still experiencing shoulder pain, neck pain and back pain.  She also states that she occasionally will get rashes underneath her breasts still.  Patient denies any recent changes in her health.  She denies any recent fevers or chills.  Patient expresses the desire to move forward with surgical intervention.  Patient also brought operative report from her  breast reduction surgery done by Dr. Towanda Malkin back in 2001.  It appears that an inferior pedicle technique was utilized.  Per the surgical pathology, 978 g of tissue was obtained from the left breast and 990 g was obtained from the right breast.  Patient's updated weight today is 252 pounds.  She is 5 feet and 5 inches tall.  Her BMI is 41.97 kg/m.  Per Dr. Marla Roe, the new estimated amount of tissue to be removed at the time of surgery 600 g bilaterally.   Review of Systems General: Denies fevers or chills MSK: Endorses ongoing back and neck discomfort Skin: Reports intermittent rashes  Physical Exam    02/04/2023    8:38 AM 12/03/2022    1:12 PM 03/23/2021    8:18 AM  Vitals with BMI  Height  '5\' 5"'$  '5\' 4"'$   Weight 252 lbs 3 oz 240 lbs 13 oz 244 lbs  BMI  [id number]  Systolic  [id number]  Diastolic  92 78  Pulse  [id number]     General:  No acute distress,  Alert and oriented, Non-Toxic, Normal speech and affect Psych: Normal behavior and mood Respiratory: No increased WOB MSK: Ambulatory  Assessment/Plan  Patient is interested in pursuing surgical intervention for bilateral breast reduction. 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  Discussed with patient we would submit  to insurance for authorization.   I instructed the patient to call if she has any questions or concerns in the meantime.  I discussed the patient's case with Dr. Staci Acosta 02/04/2023, 9:19 AM

## 2023-02-26 ENCOUNTER — Ambulatory Visit: Payer: 59 | Admitting: Podiatry

## 2023-02-26 ENCOUNTER — Ambulatory Visit (INDEPENDENT_AMBULATORY_CARE_PROVIDER_SITE_OTHER): Payer: 59

## 2023-02-26 DIAGNOSIS — M722 Plantar fascial fibromatosis: Secondary | ICD-10-CM

## 2023-02-26 DIAGNOSIS — M7750 Other enthesopathy of unspecified foot: Secondary | ICD-10-CM

## 2023-02-26 DIAGNOSIS — M7751 Other enthesopathy of right foot: Secondary | ICD-10-CM

## 2023-02-26 MED ORDER — IBUPROFEN 800 MG PO TABS: 800.0000 mg | ORAL_TABLET | Freq: Three times a day (TID) | ORAL | 0 refills | Status: AC | PRN

## 2023-02-26 NOTE — Progress Notes (Signed)
  Subjective:  Patient ID: Stefanie Baldwin, female    DOB: 1971/04/01,  MRN: [id number]  Chief Complaint  Patient presents with   Plantar Fasciitis    Flare up Right  foot heel in pain x 3 weeks. Swollen feet    52 y.o. female presents with the above complaint. History confirmed with patient.  She has had this previously and corticosteroid injection was helpful for it  Objective:  Physical Exam: warm, good capillary refill, no trophic changes or ulcerative lesions, normal DP and PT pulses, and normal sensory exam.  Right Foot: point tenderness over the heel pad and point tenderness of the mid plantar fascia  Assessment:   1. Plantar fasciitis, right      Plan:  Patient was evaluated and treated and all questions answered.  Discussed the etiology and treatment options for plantar fasciitis including stretching, formal physical therapy, supportive shoegears such as a running shoe or sneaker, pre fabricated orthoses, injection therapy, and oral medications. We also discussed the role of surgical treatment of this for patients who do not improve after exhausting non-surgical treatment options.    -Educated patient on stretching and icing of the affected limb -Injection delivered to the plantar fascia of the right foot. -Recommended wearing a supportive shoe or sneaker for now.  She does have a boot and could wear this if it continues to worsen. -Rx ibuprofen 800 mg to use as needed sent to pharmacy  Return if symptoms worsen or fail to improve.

## 2023-02-26 NOTE — Patient Instructions (Signed)

## 2023-04-01 ENCOUNTER — Ambulatory Visit: Payer: 59 | Admitting: Podiatry

## 2023-04-01 DIAGNOSIS — M7672 Peroneal tendinitis, left leg: Secondary | ICD-10-CM | POA: Diagnosis not present

## 2023-04-01 DIAGNOSIS — M722 Plantar fascial fibromatosis: Secondary | ICD-10-CM

## 2023-04-01 NOTE — Progress Notes (Signed)
Subjective:  Patient ID: Stefanie Baldwin, female    DOB: [birth year],  MRN: [id number]  Chief Complaint  Patient presents with   Foot Pain    52 y.o. female presents with the above complaint.  Patient presents for follow-up of left peroneal tendinitis as well as new complaint of right Planter fasciitis.  She states that she had to hold off on the surgery for the left side however she is ready to undergo surgery once the right side is healed up.  She would like to do another steroid injection.  She had received the previous 1 by Dr. Lilian Kapur Review of Systems: Negative except as noted in the HPI. Denies N/V/F/Ch.  Past Medical History:  Diagnosis Date   Complication of anesthesia    Ectopic pregnancy    Fibroids    uterine    GERD (gastroesophageal reflux disease)    uses OTC meds prn   Headache    Mass    on neck and chest   Obesity    Pneumonia 2 years ago    PONV (postoperative nausea and vomiting)    "severe" PONV ; does well with "patch and IV antinausea"     Current Outpatient Medications:    cetirizine (ZYRTEC) 5 MG tablet, Take 5 mg by mouth daily., Disp: , Rfl:    topiramate (TOPAMAX) 100 MG tablet, Take 1 tablet (100 mg total) by mouth 2 (two) times daily., Disp: 60 tablet, Rfl: 11   Vitamin D, Ergocalciferol, (DRISDOL) 50000 UNITS CAPS capsule, Take 50,000 Units by mouth every 7 (seven) days. Sunday, Disp: , Rfl: 3  Social History   Tobacco Use  Smoking Status Never  Smokeless Tobacco Never    Allergies  Allergen Reactions   Other     Pt does not want to receive any BLOOD    Objective:  There were no vitals filed for this visit. There is no height or weight on file to calculate BMI. Constitutional Well developed. Well nourished.  Vascular Dorsalis pedis pulses palpable bilaterally. Posterior tibial pulses palpable bilaterally. Capillary refill normal to all digits.  No cyanosis or clubbing noted. Pedal hair growth normal.  Neurologic Normal  speech. Oriented to person, place, and time. Epicritic sensation to light touch grossly present bilaterally.  Dermatologic Nails well groomed and normal in appearance. No open wounds. No skin lesions.  Orthopedic: Pain on palpation to the right calcaneal tuber at the origin of the plantar fascia.  Pain with dorsiflexion of the toes.  Pain on palpation to the left peroneal tendon pain along the course of the tendon.  Pain with dorsiflexion eversion of the foot with resistance.  No pain with plantarflexion inversion of the foot.  No pain at the ankle joint.  No pain at the posterior tibial tendon Achilles tendon ATFL ligament.   Radiographs: 3 views of skeletally mature adult left foot: No fractures noted.  No bony abnormalities identified.  Hammertoe contractures noted of 2 through 5.  Flatfoot deformities noted  1. Mild peroneus longus tendinopathy just distal to the peroneal tubercle. 2. Mild distal tibialis posterior tenosynovitis and tendinopathy, correlate clinically in assessing for tibialis posterior dysfunction. There is also some flattening of the normal longitudinal arch of the foot and pes planus is not excluded. 3. Mild plantar fasciitis involving the medial band.   Assessment:   No diagnosis found.      Plan:  Patient was evaluated and treated and all questions answered.  Right Planter fasciitis -I explained to the patient the  etiology of Planter fasciitis and various treatment options were discussed given the amount of pain that she is experiencing she will benefit from another steroid injection help decrease inflammatory component associate with pain.  Patient agrees with plan like to proceed with steroid injection -I will also place her in a cam boot she already has 1 at home she will place herself in -A steroid injection was performed at right calcaneal tuber using 1% plain Lidocaine and 10 mg of Kenalog. This was well tolerated.   Left peroneal tendinitis -I  explained the patient the etiology of tendinitis and various treatment options were extensively discussed.   -Continue cam boot immobilization -MRI was reviewed and discussed with the patient extensive detail.  Given the it shows mild peroneal tendinopathy I believe is a little bit more moderate and involved in that.  I discussed with her she will benefit from surgical exploration and if there is any tearing we will plan on repairing the tendon as well.  Will rediscuss surgical options for peroneal tendinitis during next clinical visit.     No follow-ups on file.   Right plantar fascitis injection and CAM boot has one already  Left peroneal tenditis will decided next time to reschedule

## 2023-04-23 ENCOUNTER — Ambulatory Visit: Payer: 59 | Admitting: Podiatry

## 2023-04-23 DIAGNOSIS — M722 Plantar fascial fibromatosis: Secondary | ICD-10-CM | POA: Diagnosis not present

## 2023-04-23 DIAGNOSIS — M7672 Peroneal tendinitis, left leg: Secondary | ICD-10-CM | POA: Diagnosis not present

## 2023-04-23 NOTE — Progress Notes (Signed)
Subjective:  Patient ID: Stefanie Baldwin, female    DOB: September 02, 1971,  MRN: [id number]  Chief Complaint  Patient presents with   Plantar Fasciitis    Pt stated that her feet are still bothering her     52 y.o. female presents with the above complaint.  Patient presents for follow-up of left peroneal tendinitis as right as well as right Planter fasciitis.  She states the right side boot did not help.  She states nothing has helped she still having a lot of pain denies any other acute complaints. Review of Systems: Negative except as noted in the HPI. Denies N/V/F/Ch.  Past Medical History:  Diagnosis Date   Complication of anesthesia    Ectopic pregnancy    Fibroids    uterine    GERD (gastroesophageal reflux disease)    uses OTC meds prn   Headache    Mass    on neck and chest   Obesity    Pneumonia 2 years ago    PONV (postoperative nausea and vomiting)    "severe" PONV ; does well with "patch and IV antinausea"     Current Outpatient Medications:    cetirizine (ZYRTEC) 5 MG tablet, Take 5 mg by mouth daily., Disp: , Rfl:    topiramate (TOPAMAX) 100 MG tablet, Take 1 tablet (100 mg total) by mouth 2 (two) times daily., Disp: 60 tablet, Rfl: 11   Vitamin D, Ergocalciferol, (DRISDOL) 50000 UNITS CAPS capsule, Take 50,000 Units by mouth every 7 (seven) days. Sunday, Disp: , Rfl: 3  Social History   Tobacco Use  Smoking Status Never  Smokeless Tobacco Never    Allergies  Allergen Reactions   Other     Pt does not want to receive any BLOOD    Objective:  There were no vitals filed for this visit. There is no height or weight on file to calculate BMI. Constitutional Well developed. Well nourished.  Vascular Dorsalis pedis pulses palpable bilaterally. Posterior tibial pulses palpable bilaterally. Capillary refill normal to all digits.  No cyanosis or clubbing noted. Pedal hair growth normal.  Neurologic Normal speech. Oriented to person, place, and time. Epicritic  sensation to light touch grossly present bilaterally.  Dermatologic Nails well groomed and normal in appearance. No open wounds. No skin lesions.  Orthopedic: Pain on palpation to the right calcaneal tuber at the origin of the plantar fascia.  Pain with dorsiflexion of the toes.  Pain on palpation to the left peroneal tendon pain along the course of the tendon.  Pain with dorsiflexion eversion of the foot with resistance.  No pain with plantarflexion inversion of the foot.  No pain at the ankle joint.  No pain at the posterior tibial tendon Achilles tendon ATFL ligament.   Radiographs: 3 views of skeletally mature adult left foot: No fractures noted.  No bony abnormalities identified.  Hammertoe contractures noted of 2 through 5.  Flatfoot deformities noted  1. Mild peroneus longus tendinopathy just distal to the peroneal tubercle. 2. Mild distal tibialis posterior tenosynovitis and tendinopathy, correlate clinically in assessing for tibialis posterior dysfunction. There is also some flattening of the normal longitudinal arch of the foot and pes planus is not excluded. 3. Mild plantar fasciitis involving the medial band.   Assessment:   No diagnosis found.      Plan:  Patient was evaluated and treated and all questions answered.  Right Planter fasciitis -I discussed with her she will benefit from physical therapy.  She has failed cam  boot immobilization multiple steroid injection.  At this time she will do 6 weeks of physical therapy and will come back to see me.  She states understanding.   Left peroneal tendinitis -I explained the patient the etiology of tendinitis and various treatment options were extensively discussed.   -Continue cam boot immobilization -MRI was reviewed and discussed with the patient extensive detail.  Given the it shows mild peroneal tendinopathy I believe is a little bit more moderate and involved in that.  I discussed with her she will benefit from  surgical exploration and if there is any tearing we will plan on repairing the tendon as well.  Will rediscuss surgical options for peroneal tendinitis during next clinical visit.     No follow-ups on file.   Right plantar fascitis injection and CAM boot has one already  Left peroneal tenditis will decided next time to reschedule

## 2023-05-15 ENCOUNTER — Ambulatory Visit: Payer: 59 | Admitting: Obstetrics & Gynecology

## 2023-07-28 ENCOUNTER — Encounter: Payer: Self-pay | Admitting: Podiatry

## 2023-07-28 ENCOUNTER — Ambulatory Visit: Payer: 59 | Admitting: Podiatry

## 2023-07-28 ENCOUNTER — Ambulatory Visit (INDEPENDENT_AMBULATORY_CARE_PROVIDER_SITE_OTHER): Payer: 59

## 2023-07-28 DIAGNOSIS — M21621 Bunionette of right foot: Secondary | ICD-10-CM | POA: Diagnosis not present

## 2023-07-28 DIAGNOSIS — M2041 Other hammer toe(s) (acquired), right foot: Secondary | ICD-10-CM

## 2023-07-28 DIAGNOSIS — M722 Plantar fascial fibromatosis: Secondary | ICD-10-CM

## 2023-07-28 MED ORDER — TRIAMCINOLONE ACETONIDE 10 MG/ML IJ SUSP
10.0000 mg | Freq: Once | INTRAMUSCULAR | Status: AC
  Administered 2023-07-28: 10 mg via INTRA_ARTICULAR

## 2023-07-29 NOTE — Progress Notes (Signed)
Subjective:   Patient ID: Stefanie Baldwin, female   DOB: 52 y.o.   MRN: [id number]   HPI Patient states her heel is absolutely killing her on the right foot and it simply not gotten better been present for several years she did have an MRI done the left but at this point the right ones what is bothering her the most and she is interested in correction.  States that also the third fourth and fifth digits become bothersome and the bone on the outside has been bothersome.  States that she has tried wider shoes trimming we have tried injection treatment she has tried boot usage night splint ice therapy without relief   ROS      Objective:  Physical Exam  Neurovascular status intact exquisite discomfort medial fascial band right mild discomfort Achilles tendon which appears to be compensatory lateral foot pain with hyperostosis redness around the fifth metatarsal head right and keratotic lesions digits 3 4 right and 5 with history of proximal procedure fourth right and history of surgery on the left digits.  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  She states she is tired of this pain but she is getting ready to go [place] a [place] 2 [place] and then her anniversary and would like to get this corrected afterwards.  At this point I did discuss at great length surgical intervention for fascial inflammation and digital deformities and tailor's bunion deformity.  Patient wants surgery for 345 right distal third and fourth met osteotomy fifth right plantar fascial release right but we are going to wait till she returns from trips and today to try to give her  relief after I discussed the surgical procedures I did sterile prep injected the fascia 3 mg Kenalog 5 mg Xylocaine advised on supportive therapy

## 2023-08-05 ENCOUNTER — Other Ambulatory Visit: Payer: Self-pay | Admitting: Nurse Practitioner

## 2023-08-05 DIAGNOSIS — R3129 Other microscopic hematuria: Secondary | ICD-10-CM

## 2023-09-03 ENCOUNTER — Ambulatory Visit: Payer: 59 | Admitting: Podiatry

## 2023-09-04 ENCOUNTER — Ambulatory Visit: Payer: 59 | Admitting: Podiatry

## 2023-09-04 ENCOUNTER — Encounter: Payer: Self-pay | Admitting: Podiatry

## 2023-09-04 VITALS — BP 161/90 | HR 97

## 2023-09-04 DIAGNOSIS — M21621 Bunionette of right foot: Secondary | ICD-10-CM

## 2023-09-04 DIAGNOSIS — M21619 Bunion of unspecified foot: Secondary | ICD-10-CM | POA: Diagnosis not present

## 2023-09-04 DIAGNOSIS — M2041 Other hammer toe(s) (acquired), right foot: Secondary | ICD-10-CM | POA: Diagnosis not present

## 2023-09-04 DIAGNOSIS — M722 Plantar fascial fibromatosis: Secondary | ICD-10-CM | POA: Diagnosis not present

## 2023-09-04 NOTE — Progress Notes (Signed)
Subjective:   Patient ID: Stefanie Baldwin, female   DOB: 52 y.o.   MRN: [id number]   HPI Patient states she only had a short period of relief with the injection of the heel and that is been bothering her for around a year and she wants it corrected due to failure to respond also she needs the rest of her foot corrected and is complaining about her bunion getting worse and making it hard to wear shoe gear comfortably.  States that she has tried wider shoes she has tried trimming her corns she has tried inserts injections and other treatments for the heel   ROS      Objective:  Physical Exam  Neurovascular status was found to be intact good digital perfusion well-oriented x 3 with exquisite discomfort plantar heel right at insertion structural bunion with prominence of the first metatarsal head right with a short metatarsal digital deformities digits 345 right and prominent fifth metatarsal head with pain right     Assessment:  Chronic foot structural issues right over left foot painful when pressed inability to wear shoe gear with any degree of comfort with patient having tried numerous conservative treatments over time without relief of symptoms and has been treated by [place] for around 6 months       Plan:  H&P reviewed all conditions at great length.  I have recommended due to the chronic nature of pain and the amount of discomfort she is having and failure to respond to go ahead and do an endoscopic release of the fascia McBride bunionectomy right so that we do not have to shorten the bone anymore distal arthroplasty digits 3 4 proximal arthroplasty digit 5 and metatarsal osteotomy fifth right.  I allowed her to read consent form going over alternative treatments and all complications and patient understands there is no guarantee as far as success of surgery and understands everything is outlined in the consent form.  Patient is scheduled for outpatient surgery with all questions answered today  and understands total recovery can take around 6 months and will probably be off her job for around 4 weeks.  Patient had air fracture walker dispensed with all instructions on usage and I reviewed x-rays indicating large prominence first metatarsal head deviated third and fourth digits and elevation of the 4 5 intermetatarsal angle

## 2023-09-11 ENCOUNTER — Telehealth: Payer: Self-pay | Admitting: Podiatry

## 2023-09-11 NOTE — Telephone Encounter (Signed)
Pt would to know her approximate return to work to date scheduled for 10/22. She wants to let her employer know the approximate date. On the day of the surgery, will the ibuprofen be prescribed to the pharmacy? Lastly, pt would like to know if you can sign a handicap renewal form that she has for 5 years.

## 2023-09-12 NOTE — Telephone Encounter (Signed)
She should be able to go back as long as she is sitting and can elevate her leg. Any over the counter ibuprophen is fine and that is fine to sign the handicap sticker

## 2023-09-17 ENCOUNTER — Telehealth: Payer: Self-pay | Admitting: Podiatry

## 2023-09-17 NOTE — Telephone Encounter (Signed)
DOS- 10/07/2023  KELLER/ MCBRIDE BUNIONECTOMY [id number] METATARSAL OSTEOTOMY 5TH [id number] ENDOSCOPIC PLANTAR FASCIOTOMY [id number] HAMMER TOE REPAIR 3RD-5TH [id number]  [employer] EFFECTIVE DATE- 12/17/2007  DEDUCTIBLE- $500.00 WITH REMAINING $0.00 OOP- $2000.00 WITH REMAINING $1,054.94  COINSURANCE- 20%  SPOKE WITH KEVIN C FROM [employer], HE STATED THAT PRIOR AUTHORIZATION IS NOT REQUIRED FOR CPT CODES [phone] AND [id number].  CALL REFERENCE NUMBER: KEVIN C. 09/17/2023 @ 11:06 EST

## 2023-10-06 MED ORDER — HYDROCODONE-ACETAMINOPHEN 10-325 MG PO TABS: 1.0000 | ORAL_TABLET | ORAL | 0 refills | Status: AC | PRN

## 2023-10-06 NOTE — Addendum Note (Signed)
Addended by: Lenn Sink on: 10/06/2023 04:29 PM   Modules accepted: Orders

## 2023-10-07 DIAGNOSIS — M2041 Other hammer toe(s) (acquired), right foot: Secondary | ICD-10-CM | POA: Diagnosis not present

## 2023-10-07 DIAGNOSIS — M722 Plantar fascial fibromatosis: Secondary | ICD-10-CM | POA: Diagnosis not present

## 2023-10-07 DIAGNOSIS — M2011 Hallux valgus (acquired), right foot: Secondary | ICD-10-CM | POA: Diagnosis not present

## 2023-10-07 DIAGNOSIS — M21541 Acquired clubfoot, right foot: Secondary | ICD-10-CM | POA: Diagnosis not present

## 2023-10-08 DIAGNOSIS — M722 Plantar fascial fibromatosis: Secondary | ICD-10-CM | POA: Diagnosis not present

## 2023-10-13 ENCOUNTER — Ambulatory Visit (INDEPENDENT_AMBULATORY_CARE_PROVIDER_SITE_OTHER): Payer: 59 | Admitting: Podiatry

## 2023-10-13 ENCOUNTER — Encounter: Payer: Self-pay | Admitting: Podiatry

## 2023-10-13 ENCOUNTER — Ambulatory Visit (INDEPENDENT_AMBULATORY_CARE_PROVIDER_SITE_OTHER): Payer: 59

## 2023-10-13 ENCOUNTER — Telehealth: Payer: Self-pay | Admitting: Podiatry

## 2023-10-13 DIAGNOSIS — M778 Other enthesopathies, not elsewhere classified: Secondary | ICD-10-CM

## 2023-10-13 MED ORDER — IBUPROFEN 600 MG PO TABS: 600.0000 mg | ORAL_TABLET | Freq: Three times a day (TID) | ORAL | 0 refills | Status: DC | PRN

## 2023-10-13 NOTE — Telephone Encounter (Signed)
Pt called and got a surgical shoe size large but it is too big. She was asking if she could come in tomorrow to exchange for a medium.  I verified with the CMA (Irma)that was working with Dr Charlsie Merles and she stated pt was given the size large due to all the dressing and padding that was placed on foot today, if she was to get a medium it would not fit with all the dressing and padding. I explained to pt and she understood and will discuss a medium surgical shoe once padding/dressing has been removed.

## 2023-10-13 NOTE — Progress Notes (Signed)
Subjective:   Patient ID: Stefanie Baldwin, female   DOB: 52 y.o.   MRN: [id number]   HPI Patient states doing very well with surgery very pleased so far   ROS      Objective:  Physical Exam  Neurovascular status intact with the patient's right foot doing well with mild to moderate edema with stitches intact wound edges well coapted and only mild pain     Assessment:  Over all doing well with forefoot reconstruction right     Plan:  H&P reviewed and reapplied sterile dressing continue for 2 more weeks continue elevation compression and mobilization encouraged her to start walking and stitches will be removed at next visit  X-rays indicate satisfactory healing of bone screw in place fifth metatarsal toes in good alignment

## 2023-10-27 ENCOUNTER — Ambulatory Visit (INDEPENDENT_AMBULATORY_CARE_PROVIDER_SITE_OTHER): Payer: 59 | Admitting: Podiatry

## 2023-10-27 ENCOUNTER — Ambulatory Visit (INDEPENDENT_AMBULATORY_CARE_PROVIDER_SITE_OTHER): Payer: 59

## 2023-10-27 ENCOUNTER — Encounter: Payer: Self-pay | Admitting: Podiatry

## 2023-10-27 VITALS — Ht 65.0 in | Wt 253.0 lb

## 2023-10-27 DIAGNOSIS — Z9889 Other specified postprocedural states: Secondary | ICD-10-CM | POA: Diagnosis not present

## 2023-10-27 NOTE — Progress Notes (Signed)
Subjective:   Patient ID: Stefanie Baldwin, female   DOB: 52 y.o.   MRN: [id number]   HPI Patient states overall doing very well with surgery very pleased with incisions healing well and only mild discomfort with patient utilizing boot and surgical shoe still   ROS      Objective:  Physical Exam  Neuro vascular status intact negative Denna Haggard' sign was noted wound edges right healing well toes in good alignment stitches intact     Assessment:  Doing well post forefoot reconstruction right     Plan:  H&P x-rays reviewed discussed still wearing immobilization with some's movement around the fifth metatarsal but it should heal uneventfully but I want her to watch it and removed all stitches wound edges coapted well reappoint to recheck 4 weeks earlier if needed  X-rays indicate there is improvement around the fifth metatarsal right with fixation in place should heal secondary intent uneventfully everything else looks good

## 2023-11-27 ENCOUNTER — Ambulatory Visit (INDEPENDENT_AMBULATORY_CARE_PROVIDER_SITE_OTHER): Payer: 59

## 2023-11-27 ENCOUNTER — Ambulatory Visit (INDEPENDENT_AMBULATORY_CARE_PROVIDER_SITE_OTHER): Payer: 59 | Admitting: Podiatry

## 2023-11-27 DIAGNOSIS — Z9889 Other specified postprocedural states: Secondary | ICD-10-CM

## 2023-11-27 MED ORDER — DICLOFENAC SODIUM 75 MG PO TBEC: 75.0000 mg | DELAYED_RELEASE_TABLET | Freq: Two times a day (BID) | ORAL | 2 refills | Status: DC

## 2023-11-27 NOTE — Progress Notes (Signed)
Subjective:   Patient ID: Stefanie Baldwin, female   DOB: 52 y.o.   MRN: [id number]   HPI Patient states overall doing well mild swelling but overall very pleased and I am not getting the pain underneath my heel anymore   ROS      Objective:  Physical Exam  Neurovascular status intact negative Denna Haggard' sign noted with patient's incision site and slight irritation but that is more due to skin structure with moderate forefoot edema especially around the fifth metatarsal     Assessment:  Healing that is occurring but still having some healing of the fifth metatarsal which should consolidate over the next 6 to 8 weeks     Plan:  H&P reviewed continue immobilization gradual increase in activity levels over the next 6 weeks and discussed elevation compression and placed on oral anti-inflammatory to reduce inflammation.  Reappoint 6 weeks earlier if needed  X-rays indicate healing of the fifth metatarsal still to occur but I do think it will heal uneventfully at this position over the next several months

## 2024-01-06 ENCOUNTER — Encounter: Payer: Self-pay | Admitting: Neurology

## 2024-01-06 ENCOUNTER — Other Ambulatory Visit: Payer: Self-pay | Admitting: Family Medicine

## 2024-01-06 DIAGNOSIS — E010 Iodine-deficiency related diffuse (endemic) goiter: Secondary | ICD-10-CM

## 2024-01-08 ENCOUNTER — Ambulatory Visit: Payer: 59 | Admitting: Podiatry

## 2024-01-23 ENCOUNTER — Other Ambulatory Visit: Payer: 59

## 2024-03-08 ENCOUNTER — Ambulatory Visit: Admitting: Podiatry

## 2024-03-08 ENCOUNTER — Ambulatory Visit (INDEPENDENT_AMBULATORY_CARE_PROVIDER_SITE_OTHER)

## 2024-03-08 ENCOUNTER — Encounter: Payer: Self-pay | Admitting: Podiatry

## 2024-03-08 DIAGNOSIS — G8918 Other acute postprocedural pain: Secondary | ICD-10-CM | POA: Diagnosis not present

## 2024-03-08 DIAGNOSIS — M21622 Bunionette of left foot: Secondary | ICD-10-CM

## 2024-03-08 DIAGNOSIS — M2042 Other hammer toe(s) (acquired), left foot: Secondary | ICD-10-CM | POA: Diagnosis not present

## 2024-03-08 NOTE — Progress Notes (Unsigned)
 NEUROLOGY CONSULTATION NOTE  Stefanie Baldwin MRN: [id number] DOB: [birth year]  Referring provider: Cain Saupe, MD Primary care provider: Elie Confer, NP  Reason for consult:  migraines  Assessment/Plan:   Migraine aura without headache Migraine with aura, without status migrainosus, not intractable Memory deficits - either aura or secondary to topiramate.   Migraine prevention:  Discussed CGRP inhibitors (both mABs and gepants).  She will research them and inquire what may be formulary by her insurance and get back to me on her decision.  In meantime, will continue topiramate for now with plan to discontinue. Migraine rescue:  rizatriptan 5mg  Check B12 and TSH Limit use of pain relievers to no more than 2 days out of week to prevent risk of rebound or medication-overuse headache. Keep headache diary Follow up 6 months.    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  History supplemented by referring provider's and prior neurologist's notes.  She has had frequent headaches since young adulthood.  They are severe bilateral frontal/periorbital pain with nausea and photophobia.  In January 2021, she began experiencing new visual aura, described as either blurred vision, flashing lights, or looks like a film over her eyes.  She saw neurology at that time.  MRI and MRA of brain was normal.  She was started on topiramate.  Since then, headaches have been better controlled, usually once a week. Will take a rizatriptan and sleep it off.  However, she is still experiencing visual aura which can last from an hour to a day and sometimes up to a week.  She also reports memory problems.  In particular, she has word-finding difficulty and cannot collect her thoughts.  This may occur a couple of days a week.    Eye exam has been unremarkable. 01/14/2020 MRI BRAIN W WO:  Normal MRI of the brain 01/14/2020 MRA HEAD WO:  Normal  intracranial MR angiography  Past NSAIDS/analgesics:  ketorolac inj, diclofenac 75mg , Goodys, meloxicam Past abortive triptans: none Past abortive ergotamine:  none Past muscle relaxants:  none Past anti-emetic:  ondansetron  Past antihypertensive medications:  none Past antidepressant medications:  nortriptyline Past anticonvulsant medications:  none Past anti-CGRP:  none Past vitamins/Herbal/Supplements:  none Past antihistamines/decongestants:  Zyrtec, scopolamine Other past therapies:  none  Current NSAIDS/analgesics:  ibuprofen 800mg  Current triptans: rizatriptan Current ergotamine:  none Current anti-emetic:  none Current muscle relaxants:  none Current Antihypertensive medications:  none Current Antidepressant medications:  none Current Anticonvulsant medications:  topiramate 100mg  twice daily Current anti-CGRP:  none Current Vitamins/Herbal/Supplements:  D3 Current Antihistamines/Decongestants:  none Other therapy:  none Other medications:  Wegovy   Works as an Research scientist (medical).      PAST MEDICAL HISTORY: Past Medical History:  Diagnosis Date   Complication of anesthesia    Ectopic pregnancy    Fibroids    uterine    GERD (gastroesophageal reflux disease)    uses OTC meds prn   Headache    Mass    on neck and chest   Obesity    Pneumonia 2 years ago    PONV (postoperative nausea and vomiting)    "severe" PONV ; does well with "patch and IV antinausea"     PAST SURGICAL HISTORY: Past Surgical History:  Procedure Laterality Date   CYST REMOVAL HAND     wrist   EXCISION MASS NECK N/A 12/01/2017   Procedure: EXCISION MASS POSTERIOR NECK;  Surgeon: Louisa Second, MD;  Location: [facility];  Service: Plastics;  Laterality: N/A;   FOOT SURGERY     LIPOSUCTION N/A 12/01/2017   Procedure: LIPOSUCTION;  Surgeon: Louisa Second, MD;  Location: [facility];  Service: Plastics;  Laterality: N/A;   OVARIAN CYST REMOVAL      REDUCTION MAMMAPLASTY Bilateral    ROBOTIC ASSISTED LAPAROSCOPIC HYSTERECTOMY AND SALPINGECTOMY Bilateral 10/06/2018   Procedure: XI ROBOTIC ASSISTED TOTAL LAPAROSCOPIC HYSTERECTOMY AND SALPINGECTOMY;  Surgeon: Genia Del, MD;  Location: [facility];  Service: Gynecology;  Laterality: Bilateral;   ROBOTIC ASSISTED LAPAROSCOPIC LYSIS OF ADHESION  10/06/2018   Procedure: XI ROBOTIC ASSISTED LAPAROSCOPIC LYSIS OF ADHESION;  Surgeon: Genia Del, MD;  Location: [facility];  Service: Gynecology;;    MEDICATIONS: Current Outpatient Medications on File Prior to Visit  Medication Sig Dispense Refill   cetirizine (ZYRTEC) 5 MG tablet Take 5 mg by mouth daily.     diclofenac (VOLTAREN) 75 MG EC tablet Take 1 tablet (75 mg total) by mouth 2 (two) times daily. 50 tablet 2   ibuprofen (ADVIL) 600 MG tablet Take 1 tablet (600 mg total) by mouth every 8 (eight) hours as needed. 60 tablet 0   topiramate (TOPAMAX) 100 MG tablet Take 1 tablet (100 mg total) by mouth 2 (two) times daily. 60 tablet 11   Vitamin D, Ergocalciferol, (DRISDOL) 50000 UNITS CAPS capsule Take 50,000 Units by mouth every 7 (seven) days. Sunday  3   No current facility-administered medications on file prior to visit.    ALLERGIES: Allergies  Allergen Reactions   Other     Pt does not want to receive any BLOOD     FAMILY HISTORY: Family History  Problem Relation Age of Onset   Thyroid disease Mother    Diabetes Mother    Hypertension Mother    Heart disease Mother    Diabetes Father    Thyroid disease Brother    Colon cancer Neg Hx    Stomach cancer Neg Hx    Rectal cancer Neg Hx    Esophageal cancer Neg Hx    Liver cancer Neg Hx    Breast cancer Neg Hx     Objective:  Blood pressure 120/81, pulse 98, resp. rate 20, height 5\' 5"  (1.651 m), weight 222 lb (100.7 kg), last menstrual period 09/27/2018, SpO2 99%. General: No acute distress.  Patient appears well-groomed.   Head:  Normocephalic/atraumatic Eyes:   fundi examined but not visualized Neck: supple, no paraspinal tenderness, full range of motion Heart: regular rate and rhythm Neurological Exam: Mental status: alert and oriented to person, place, and time, speech fluent and not dysarthric, language intact. Cranial nerves: CN I: not tested CN II: pupils equal, round and reactive to light, visual fields intact CN III, IV, VI:  full range of motion, no nystagmus, no ptosis CN V: facial sensation intact. CN VII: upper and lower face symmetric CN VIII: hearing intact CN IX, X: gag intact, uvula midline CN XI: sternocleidomastoid and trapezius muscles intact CN XII: tongue midline Bulk & Tone: normal, no fasciculations. Motor:  muscle strength 5/5 throughout Sensation:  Pinprick and vibratory sensation intact. Deep Tendon Reflexes:  2+ throughout,  toes downgoing.   Finger to nose testing:  Without dysmetria.     Gait:  Normal station and stride.  Romberg negative.    Thank you for allowing me to take part in the care of this patient.  Shon Millet, DO  CC:  Cain Saupe, MD  Elie Confer,  NP

## 2024-03-09 ENCOUNTER — Ambulatory Visit: Payer: 59 | Admitting: Neurology

## 2024-03-09 ENCOUNTER — Encounter: Payer: Self-pay | Admitting: Neurology

## 2024-03-09 ENCOUNTER — Other Ambulatory Visit

## 2024-03-09 VITALS — BP 120/81 | HR 98 | Resp 20 | Ht 65.0 in | Wt 222.0 lb

## 2024-03-09 DIAGNOSIS — G43109 Migraine with aura, not intractable, without status migrainosus: Secondary | ICD-10-CM | POA: Diagnosis not present

## 2024-03-09 DIAGNOSIS — R413 Other amnesia: Secondary | ICD-10-CM | POA: Diagnosis not present

## 2024-03-09 NOTE — Patient Instructions (Addendum)
 Contact your insurance to find out which is covered and let me know which medication you want to try: Ajovy monthly injection Emgality monthly injection Aimovig monthly injection Qulipta daily pill Nurtec pill every other day Check B12 and TSH Continue topiramate for now but will eventually discontinue Limit use of pain relievers to no more than 2 days out of week to prevent risk of rebound or medication-overuse headache. Follow up 6 months.

## 2024-03-10 LAB — TSH: TSH: 1.5 m[IU]/L

## 2024-03-10 LAB — VITAMIN B12: Vitamin B-12: 515 pg/mL (ref 200–1100)

## 2024-03-10 NOTE — Progress Notes (Signed)
 Subjective:   Patient ID: Stefanie Baldwin, female   DOB: 53 y.o.   MRN: [id number]   HPI Patient states my right foot is doing well but I wish I would have fix my left foot at the same time as it also bothers me and I want to get it evaluated   ROS      Objective:  Physical Exam  Neurovascular status intact with good healing of the right foot first MPJ in good alignment good range of motion lesser digits good alignment with satisfactory positioning with patient found to have prominence around the fifth metatarsal head left and third fourth and fifth digits on the left keratotic lesions that can become painful especially fifth digit     Assessment:  Doing well with right foot with pathology of the left foot noted     Plan:  H&P final x-ray right taken discussed left foot discussed possible arthroplasties and metatarsal osteotomy fifth left and today I went ahead and I debrided the lesion on the fifth toe to give her temporary relief.  Surgical intervention will probably be undertaken later this year  X-rays right look good everything is in good alignment satisfactory position

## 2024-03-11 ENCOUNTER — Encounter: Payer: Self-pay | Admitting: Neurology

## 2024-05-13 ENCOUNTER — Ambulatory Visit: Payer: 59 | Admitting: Neurology

## 2024-07-02 ENCOUNTER — Ambulatory Visit (INDEPENDENT_AMBULATORY_CARE_PROVIDER_SITE_OTHER)

## 2024-07-02 ENCOUNTER — Encounter: Payer: Self-pay | Admitting: Podiatry

## 2024-07-02 ENCOUNTER — Ambulatory Visit: Admitting: Podiatry

## 2024-07-02 VITALS — Ht 65.0 in | Wt 222.0 lb

## 2024-07-02 DIAGNOSIS — M7751 Other enthesopathy of right foot: Secondary | ICD-10-CM

## 2024-07-02 DIAGNOSIS — G8918 Other acute postprocedural pain: Secondary | ICD-10-CM

## 2024-07-04 NOTE — Progress Notes (Signed)
 Subjective:   Patient ID: Stefanie Baldwin, female   DOB: 53 y.o.   MRN: [id number]   HPI Patient states the fourth toe right is still sore after surgery and has callus on the fifth digit left she knows she will need to get that fixed someday and the right big toe   ROS      Objective:  Physical Exam  Neurovascular status intact patient has mild edema of the fourth digit right but the positional component of the toe is excellent and I did not note pathology as far as positioning goes.  Patient does have several other toes with keratotic lesions rotated fifth digit     Assessment:  Difficult to say whether or not this may just be still part of the healing process or there could be any other pathology with the fourth toe right with hammertoe deformity     Plan:  H&P x-ray reviewed and I have advised on waiting approximately 4 to 6 months and that at 1 point organ to need to consider trying to inject around the inner phalangeal joint to try to reduce inflammation.  Hopefully it will go away in the next few months  X-rays indicate satisfactory resection of bone digit 4 right good alignment no regrowth noted

## 2024-09-24 ENCOUNTER — Other Ambulatory Visit: Payer: Self-pay | Admitting: Family Medicine

## 2024-09-24 DIAGNOSIS — Z1231 Encounter for screening mammogram for malignant neoplasm of breast: Secondary | ICD-10-CM

## 2024-10-14 ENCOUNTER — Other Ambulatory Visit: Payer: Self-pay | Admitting: Nurse Practitioner

## 2024-10-14 DIAGNOSIS — R109 Unspecified abdominal pain: Secondary | ICD-10-CM

## 2024-10-20 ENCOUNTER — Ambulatory Visit
Admission: RE | Admit: 2024-10-20 | Discharge: 2024-10-20 | Disposition: A | Discharge status: Home / Self Care | Source: Ambulatory Visit | Attending: Nurse Practitioner | Admitting: Nurse Practitioner

## 2024-10-20 DIAGNOSIS — R109 Unspecified abdominal pain: Secondary | ICD-10-CM

## 2024-12-30 ENCOUNTER — Ambulatory Visit: Admitting: Podiatry

## 2024-12-30 ENCOUNTER — Encounter: Payer: Self-pay | Admitting: Podiatry

## 2024-12-30 ENCOUNTER — Ambulatory Visit

## 2024-12-30 DIAGNOSIS — M7751 Other enthesopathy of right foot: Secondary | ICD-10-CM

## 2024-12-30 MED ORDER — TRIAMCINOLONE ACETONIDE 10 MG/ML IJ SUSP
10.0000 mg | Freq: Once | INTRAMUSCULAR | Status: AC
  Administered 2024-12-30: 10 mg via INTRA_ARTICULAR

## 2024-12-30 NOTE — Progress Notes (Signed)
 Subjective:   Patient ID: Stefanie Baldwin, female   DOB: 54 y.o.   MRN: [id number]   HPI Patient states fourth toe on my right foot is still bothering me at the end.  It looks great but it just hurts   ROS      Objective:  Physical Exam  Neurovascular status intact with inflammation pain distal fourth toe right foot fluid buildup around the area localized no other pathology noted excellent alignment of the toe     Assessment:  Appears to be an inflammatory condition very difficult to understand the genesis of this pathology     Plan:  H&P x-ray taken and I went ahead today anesthetized the right fourth toe I did sterile prep I injected distal tissue with 2 mg dexamethasone  Kenalog  5 mg Xylocaine  and debrided any tissue I can find debrided the fifth toe left the hallux right she wants to have surgery on her left fifth toe fourth toe third toe and we will discuss this at next visit in 2 months and hopefully the right toe will be better  X-rays indicate satisfactory position of the fourth toe resection of bone no other pathology noted at this time

## 2025-01-05 ENCOUNTER — Ambulatory Visit: Admitting: Podiatry

## 2025-02-24 ENCOUNTER — Ambulatory Visit: Admitting: Podiatry
